# Patient Record
Sex: Male | Born: 1935 | Race: Black or African American | Hispanic: No | Marital: Married | Smoking: Never smoker
Health system: Southern US, Community
[De-identification: ages and names within clinical notes are randomized; demographics above are authoritative.]

## PROBLEM LIST (undated history)

## (undated) DIAGNOSIS — I2510 Atherosclerotic heart disease of native coronary artery without angina pectoris: Secondary | ICD-10-CM

## (undated) DIAGNOSIS — E119 Type 2 diabetes mellitus without complications: Secondary | ICD-10-CM

## (undated) DIAGNOSIS — I10 Essential (primary) hypertension: Secondary | ICD-10-CM

---

## 2003-10-09 ENCOUNTER — Encounter: Admission: RE | Admit: 2003-10-09 | Discharge: 2003-10-09 | Payer: Self-pay | Admitting: Family Medicine

## 2003-12-10 ENCOUNTER — Encounter: Admission: RE | Admit: 2003-12-10 | Discharge: 2003-12-31 | Payer: Self-pay | Admitting: Neurosurgery

## 2004-01-06 ENCOUNTER — Encounter: Payer: Self-pay | Admitting: Family Medicine

## 2004-04-28 ENCOUNTER — Ambulatory Visit: Payer: Self-pay | Admitting: Family Medicine

## 2004-05-23 ENCOUNTER — Emergency Department (HOSPITAL_COMMUNITY): Admission: EM | Admit: 2004-05-23 | Discharge: 2004-05-24 | Payer: Self-pay | Admitting: Emergency Medicine

## 2004-05-26 ENCOUNTER — Ambulatory Visit (HOSPITAL_COMMUNITY): Admission: RE | Admit: 2004-05-26 | Discharge: 2004-05-26 | Payer: Self-pay | Admitting: Gastroenterology

## 2004-06-09 ENCOUNTER — Inpatient Hospital Stay (HOSPITAL_BASED_OUTPATIENT_CLINIC_OR_DEPARTMENT_OTHER): Admission: RE | Admit: 2004-06-09 | Discharge: 2004-06-09 | Payer: Self-pay | Admitting: Cardiology

## 2010-04-24 ENCOUNTER — Encounter: Payer: Self-pay | Admitting: Cardiology

## 2012-03-01 ENCOUNTER — Emergency Department (HOSPITAL_COMMUNITY)
Admission: EM | Admit: 2012-03-01 | Discharge: 2012-03-01 | Disposition: A | Discharge status: Home / Self Care | Payer: Medicare Other | Attending: Emergency Medicine | Admitting: Emergency Medicine

## 2012-03-01 ENCOUNTER — Emergency Department (HOSPITAL_COMMUNITY): Payer: Medicare Other

## 2012-03-01 ENCOUNTER — Encounter (HOSPITAL_COMMUNITY): Payer: Self-pay | Admitting: *Deleted

## 2012-03-01 DIAGNOSIS — N400 Enlarged prostate without lower urinary tract symptoms: Secondary | ICD-10-CM | POA: Insufficient documentation

## 2012-03-01 DIAGNOSIS — E119 Type 2 diabetes mellitus without complications: Secondary | ICD-10-CM | POA: Insufficient documentation

## 2012-03-01 DIAGNOSIS — I10 Essential (primary) hypertension: Secondary | ICD-10-CM | POA: Insufficient documentation

## 2012-03-01 DIAGNOSIS — N23 Unspecified renal colic: Secondary | ICD-10-CM | POA: Insufficient documentation

## 2012-03-01 DIAGNOSIS — I2510 Atherosclerotic heart disease of native coronary artery without angina pectoris: Secondary | ICD-10-CM | POA: Insufficient documentation

## 2012-03-01 DIAGNOSIS — Z79899 Other long term (current) drug therapy: Secondary | ICD-10-CM | POA: Insufficient documentation

## 2012-03-01 DIAGNOSIS — K5900 Constipation, unspecified: Secondary | ICD-10-CM | POA: Insufficient documentation

## 2012-03-01 HISTORY — DX: Essential (primary) hypertension: I10

## 2012-03-01 HISTORY — DX: Atherosclerotic heart disease of native coronary artery without angina pectoris: I25.10

## 2012-03-01 HISTORY — DX: Type 2 diabetes mellitus without complications: E11.9

## 2012-03-01 LAB — CBC WITH DIFFERENTIAL/PLATELET
Basophils Relative: 0 % (ref 0–1)
Eosinophils Absolute: 0.1 10*3/uL (ref 0.0–0.7)
Hemoglobin: 12.9 g/dL — ABNORMAL LOW (ref 13.0–17.0)
MCH: 31.2 pg (ref 26.0–34.0)
MCHC: 34.1 g/dL (ref 30.0–36.0)
Monocytes Relative: 7 % (ref 3–12)
Neutrophils Relative %: 62 % (ref 43–77)
RDW: 12.9 % (ref 11.5–15.5)

## 2012-03-01 LAB — URINALYSIS, ROUTINE W REFLEX MICROSCOPIC
Bilirubin Urine: NEGATIVE
Ketones, ur: NEGATIVE mg/dL
Nitrite: NEGATIVE
Protein, ur: NEGATIVE mg/dL
Urobilinogen, UA: 0.2 mg/dL (ref 0.0–1.0)
pH: 5.5 (ref 5.0–8.0)

## 2012-03-01 LAB — COMPREHENSIVE METABOLIC PANEL
Albumin: 4.1 g/dL (ref 3.5–5.2)
Alkaline Phosphatase: 64 U/L (ref 39–117)
BUN: 24 mg/dL — ABNORMAL HIGH (ref 6–23)
Creatinine, Ser: 1.49 mg/dL — ABNORMAL HIGH (ref 0.50–1.35)
Potassium: 4 mEq/L (ref 3.5–5.1)
Total Protein: 8.1 g/dL (ref 6.0–8.3)

## 2012-03-01 LAB — URINE MICROSCOPIC-ADD ON

## 2012-03-01 IMAGING — CT CT ABD-PELV W/O CM
1 of 4 series · 14 of 32 positions shown, 18 images · non-contrast
Comparison: Acute abdominal series [DATE]

CLINICAL DATA: Right flank pain

CT ABDOMEN AND PELVIS WITHOUT CONTRAST
TECHNIQUE: Multidetector CT imaging of the abdomen and pelvis was
performed following the standard protocol without intravenous
contrast.

[Series 2: abd/pel w/o · axial · non-contrast · 0.74mm/px · z∈[-401,-31]mm · 14 of 82 slices shown, 18 images]
[im 4/82  soft-tissue]
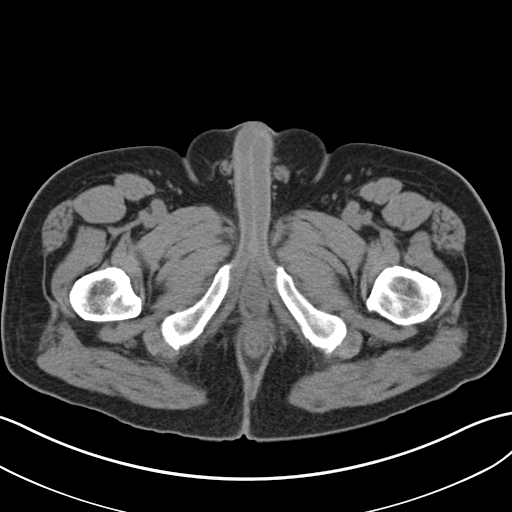
[im 4/82  bone]
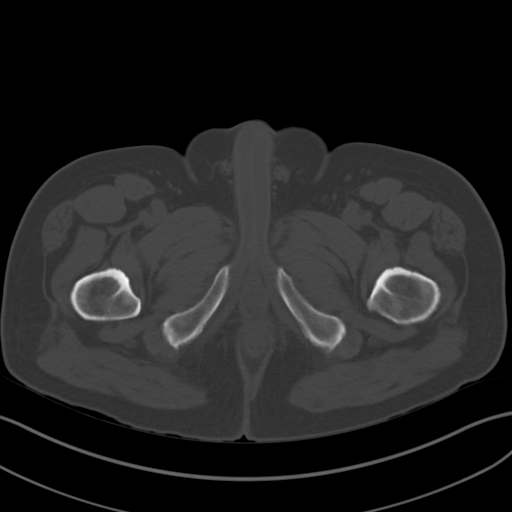
[im 12/82  soft-tissue]
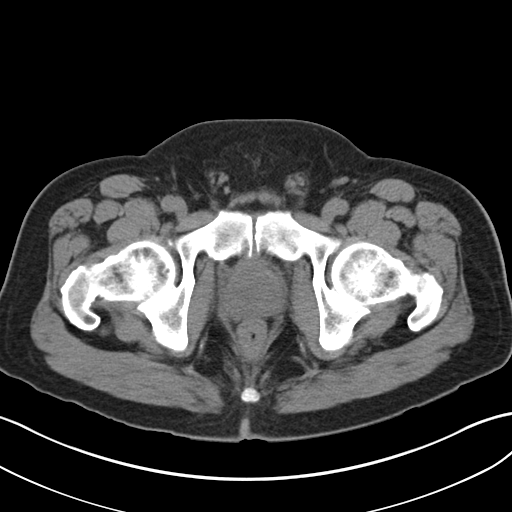
[im 19/82  soft-tissue]
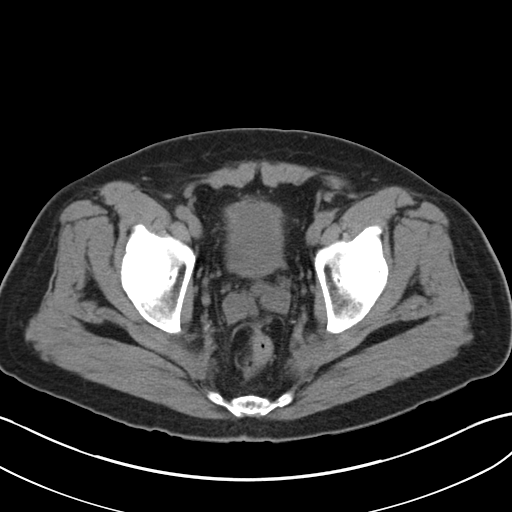
[im 26/82  soft-tissue]
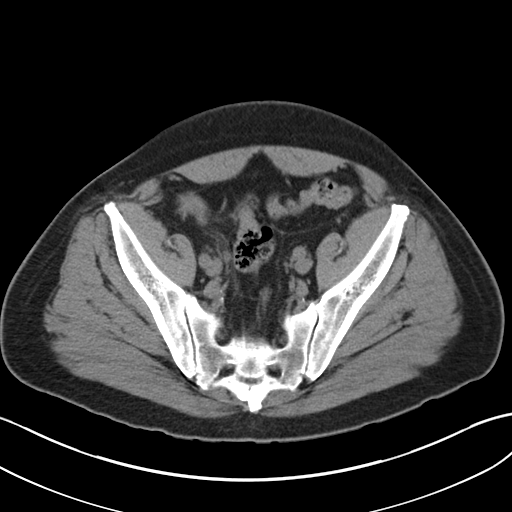
[im 30/82  soft-tissue]
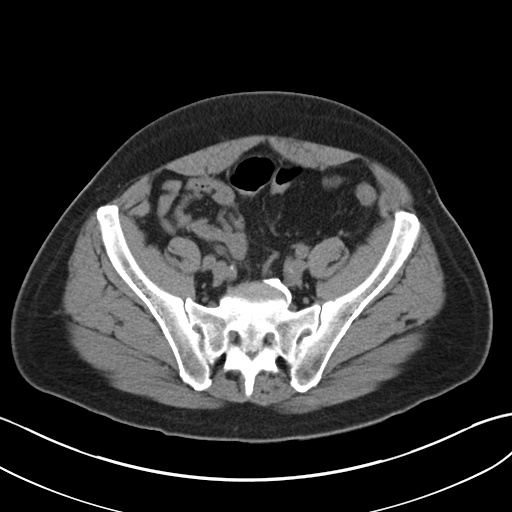
[im 37/82  soft-tissue]
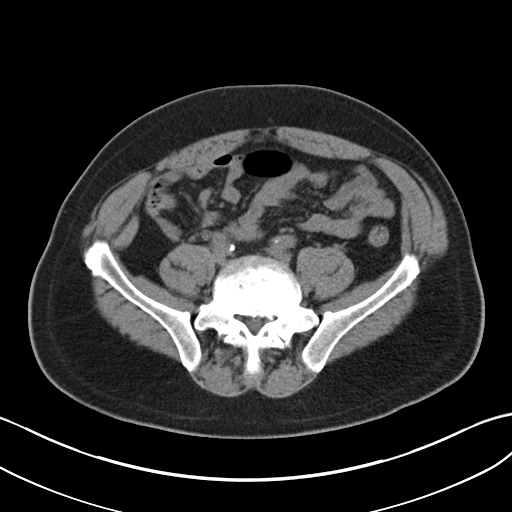
[im 45/82  soft-tissue]
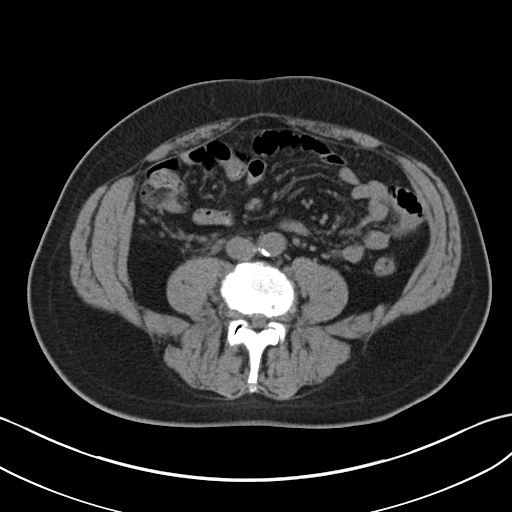
[im 52/82  soft-tissue]
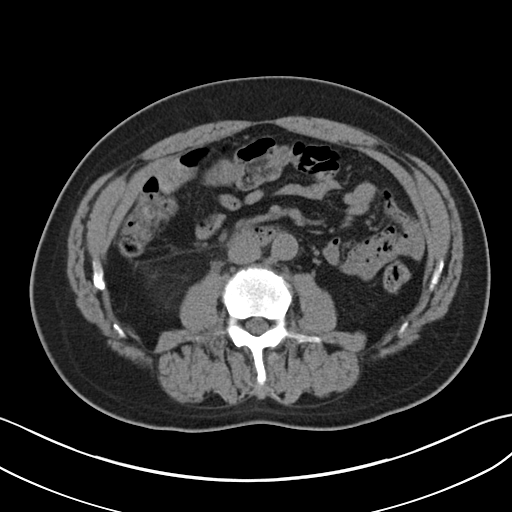
[im 56/82  soft-tissue]
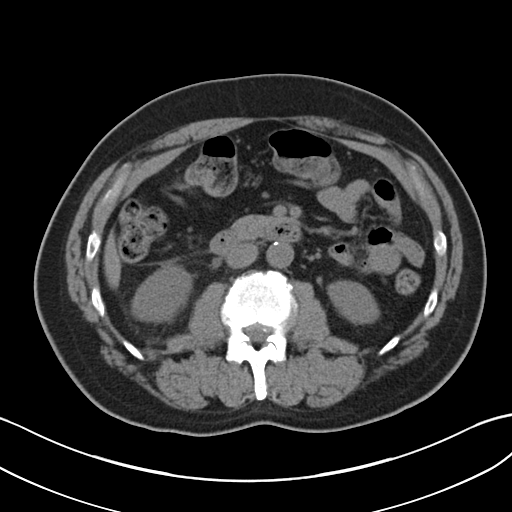
[im 56/82  bone]
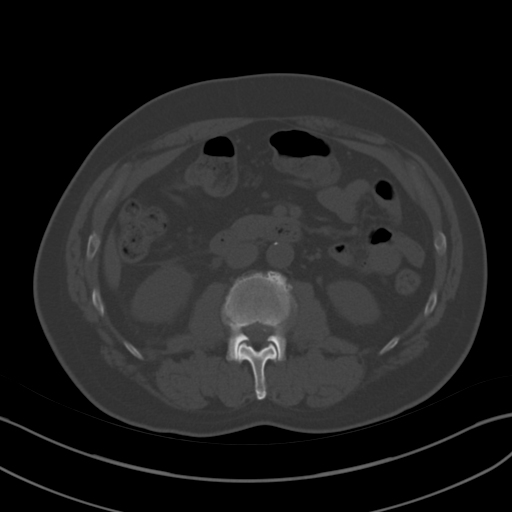
[im 63/82  soft-tissue]
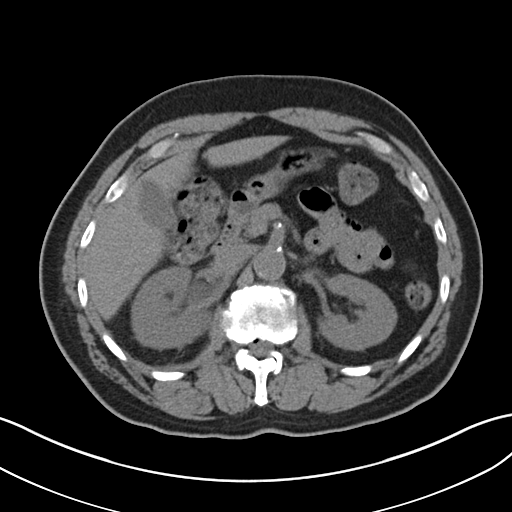
[im 67/82  lung]
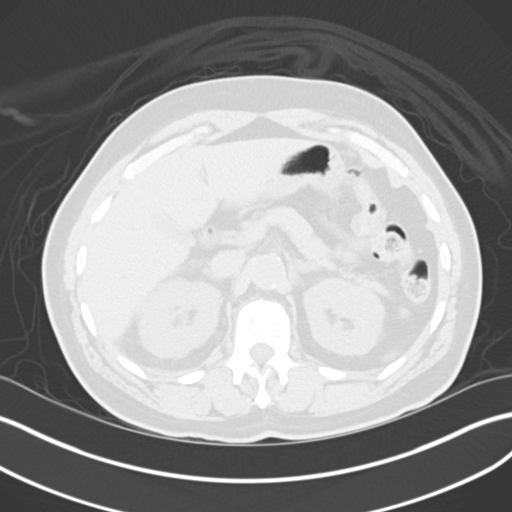
[im 70/82  soft-tissue]
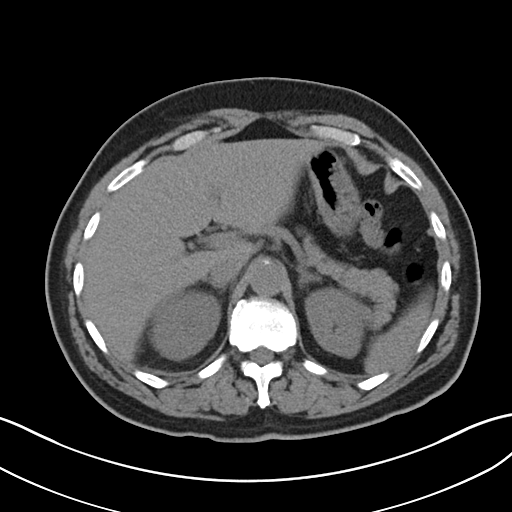
[im 70/82  lung]
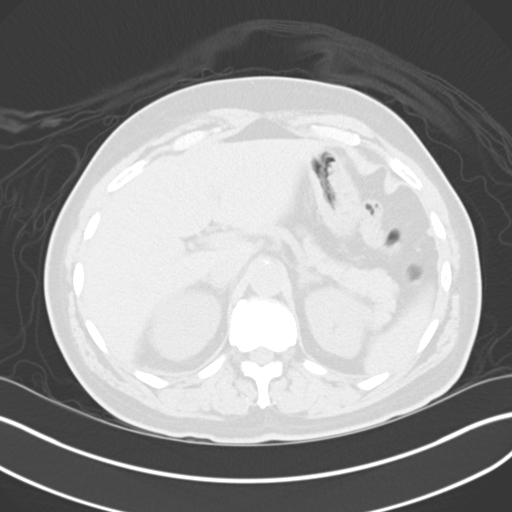
[im 74/82  lung]
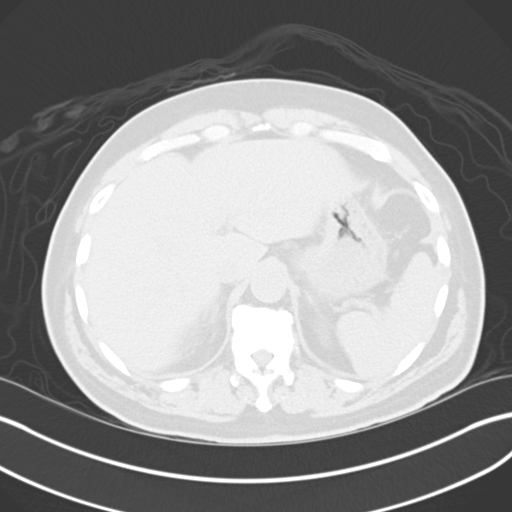
[im 78/82  soft-tissue]
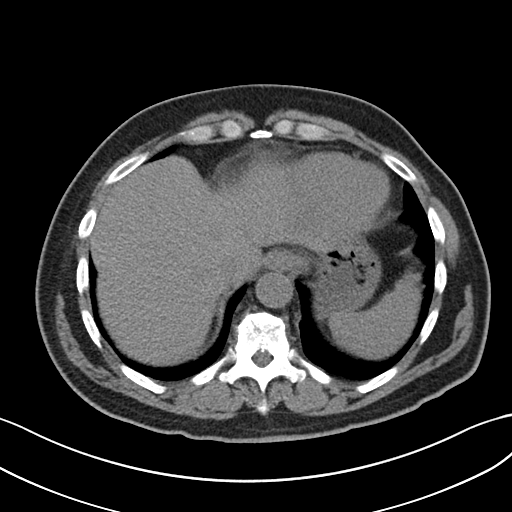
[im 78/82  lung]
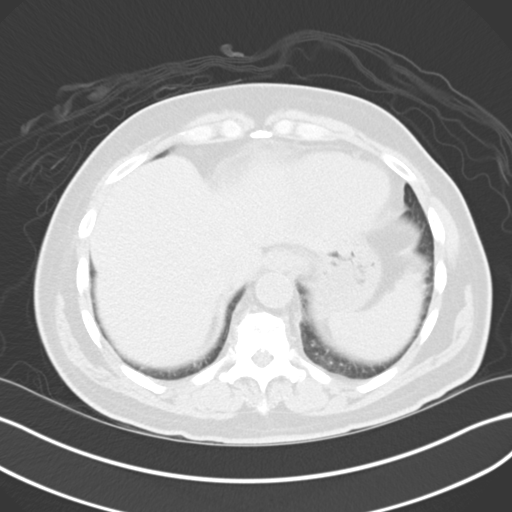

[14 of 32 positions shown; findings below may reference images not displayed]

FINDINGS: Inferior lung bases:  Atherosclerotic calcification of
the right coronary artery is visible.  The heart is not completely
imaged but appears enlarged.

The dome of the liver is not included on the study, per the CT
stone protocol.  The visualized portion of the liver, spleen,
adrenal glands, left kidney, gallbladder, and pancreas appear
within normal limits for noncontrast imaging.

There is moderate right sided hydroureteronephrosis.  This is
secondary to a 2 mm stone in the distal right ureter.  This stone
is approximately 1 cm proximal to the ureterovesical junction.
There is mild right perinephric and periureteric stranding.

No intrarenal calculi are seen on the right.  No renal lesions are
identified by noncontrast CT.

The stomach is decompressed.  Small bowel loops and colon are
normal in caliber.  Abdominal aorta is normal in caliber and
contains scattered atherosclerotic calcification.

Urinary bladder is not very distended at the time imaging.  Bladder
wall thickness measures 5 mm, and mild bladder wall thickening
cannot be excluded.

Prostamegaly is noted.  Prostate measures 5 cm AP diameter by
cm transverse diameter, and bulges into the bladder base.

Negative for abdominal or pelvic lymphadenopathy.

A tiny umbilical hernia contains fat only.  There is a tiny amount
of ascites in the dependent portion of the pelvis.

Multilevel degenerative disc disease is present, most prominent at
L4-5, where there is disc space narrowing, posterior disc
osteophyte complex and vacuum disc phenomenon..  No acute or
suspicious bony abnormality.
IMPRESSION: 1.  2 mm distal right ureteral stone results in moderate right-
sided hydroureteronephrosis and perinephric stranding.
2.  No additional urinary tract calculi identified.

3.  Prostamegaly.  Correlation with PSA values should be
considered.  Cannot exclude mild urinary bladder wall thickening
due to chronic bladder outlet obstruction (apparent wall thickening
could be due to the underdistension of the bladder at the time of
imaging).
4.  Multilevel degenerative disc disease.

## 2012-03-01 MED ORDER — TAMSULOSIN HCL 0.4 MG PO CAPS: 0.4000 mg | ORAL_CAPSULE | Freq: Every day | ORAL | Status: AC

## 2012-03-01 MED ORDER — HYDROCODONE-ACETAMINOPHEN 5-325 MG PO TABS: ORAL_TABLET | ORAL | Status: DC

## 2012-03-01 NOTE — Progress Notes (Signed)
  CARE MANAGEMENT ED NOTE 03/01/2012  Patient:  CAREL, CARRIER   Account Number:  [id number]  Date Initiated:  03/01/2012  Documentation initiated by:  Edd Arbour  Subjective/Objective Assessment:   42 male [employer] medicare no pcp listed Pt states pcp is Dr Marlane Mingle n [facility] c/o constipation imaging shows non obstructive gas pattern     Subjective/Objective Assessment Detail:     Action/Plan:   spoke with pt EPIC updated   Action/Plan Detail:   Anticipated DC Date:  03/01/2012     Status Recommendation to Physician:   Result of Recommendation:    Other ED Services  Consult Working Plan    DC Planning Services  PCP issues  Other    Choice offered to / List presented to:            Status of service:  Completed, signed off  ED Comments:   ED Comments Detail:

## 2012-03-01 NOTE — ED Notes (Signed)
Pt presents with abd pain in rt flank area, constipation, last BM yesterday 4am

## 2012-03-01 NOTE — ED Provider Notes (Signed)
Medical screening examination/treatment/procedure(s) were conducted as a shared visit with non-physician practitioner(s) and myself.  I personally evaluated the patient during the encounter  Pt with ttp right flank region.  Mentions poor appetite as well.  No pulsatile mass on exam.  Considering his age and the undefined etiology, will check labs, ua and CT scan of abdomen and pelvis.  Celene Kras, MD 03/01/12 [phone]

## 2012-03-01 NOTE — ED Provider Notes (Signed)
History     CSN: [id number]  Arrival date & time 03/01/12  1329   First MD Initiated Contact with Patient 03/01/12 1358      Chief Complaint  Patient presents with  . Abdominal Pain  . Constipation  . Flank Pain    (Consider location/radiation/quality/duration/timing/severity/associated sxs/prior treatment) HPI Comments: Patients with history of diabetes, no previous abdominal surgeries or abdominal problems presents today with 24 hour history of right lower back, flank pain. This is been associated with constipation. Patient states that his last bowel movement was yesterday at 4 AM and he typically has 2 bowel movements a day. He was not constipated before this does not have a history of chronic constipation. Patient denies any fevers, chest pain, shortness of breath, painful urination, trouble with urination, diarrhea. Patient has taken Senokot and some oral laxatives without relief. Onset acute. Course is constant. Nothing makes symptoms better worse.   Patient is a 78 y.o. male presenting with abdominal pain, constipation, and flank pain. The history is provided by the patient.  Abdominal Pain The primary symptoms of the illness include abdominal pain. The primary symptoms of the illness do not include fever, nausea, vomiting, diarrhea or dysuria.  Additional symptoms associated with the illness include constipation.  Constipation  Associated symptoms include abdominal pain. Pertinent negatives include no fever, no diarrhea, no nausea, no vomiting, no chest pain, no headaches, no coughing and no rash.  Flank Pain Associated symptoms include abdominal pain. Pertinent negatives include no chest pain, coughing, fever, headaches, myalgias, nausea, rash, sore throat or vomiting.    Past Medical History  Diagnosis Date  . Diabetes mellitus without complication   . Hypertension   . Coronary artery disease     History reviewed. No pertinent past surgical history.  No family history  on file.  History  Substance Use Topics  . Smoking status: Never Smoker   . Smokeless tobacco: Not on file  . Alcohol Use: No      Review of Systems  Constitutional: Negative for fever.  HENT: Negative for sore throat and rhinorrhea.   Eyes: Negative for redness.  Respiratory: Negative for cough.   Cardiovascular: Negative for chest pain.  Gastrointestinal: Positive for abdominal pain and constipation. Negative for nausea, vomiting and diarrhea.  Genitourinary: Positive for flank pain. Negative for dysuria.  Musculoskeletal: Negative for myalgias.  Skin: Negative for rash.  Neurological: Negative for headaches.    Allergies  Review of patient's allergies indicates no known allergies.  Home Medications   Current Outpatient Rx  Name  Route  Sig  Dispense  Refill  . CARVEDILOL 25 MG PO TABS   Oral   Take 25 mg by mouth 2 (two) times daily with a meal.         . GLIMEPIRIDE 2 MG PO TABS   Oral   Take 2 mg by mouth daily before breakfast.         . GUAIFENESIN 100 MG/5ML PO SYRP   Oral   Take 200 mg by mouth 3 (three) times daily as needed. cough         . METFORMIN HCL ER (OSM) 1000 MG PO TB24   Oral   Take 1,000 mg by mouth 2 (two) times daily with a meal.         . OLMESARTAN MEDOXOMIL 40 MG PO TABS   Oral   Take 40 mg by mouth daily.         Marland Kitchen RAMIPRIL 10 MG PO CAPS  Oral   Take 10 mg by mouth 2 (two) times daily.         Bernadette Hoit SODIUM 8.6-50 MG PO TABS   Oral   Take 2 tablets by mouth daily.         Marland Kitchen SIMETHICONE 80 MG PO CHEW   Oral   Chew 80 mg by mouth every 6 (six) hours as needed. Gas pain         . SIMVASTATIN 40 MG PO TABS   Oral   Take 40 mg by mouth every evening.         Marland Kitchen SPIRONOLACTONE 25 MG PO TABS   Oral   Take 25 mg by mouth 2 (two) times daily.         Marland Kitchen HYDROCODONE-ACETAMINOPHEN 5-325 MG PO TABS      Take 0.5 - 1 tablet every 6 hours as needed for severe pain   8 tablet   0   . TAMSULOSIN  HCL 0.4 MG PO CAPS   Oral   Take 1 capsule (0.4 mg total) by mouth daily.   7 capsule   0     BP 189/79  Pulse 68  Temp 97.7 F (36.5 C) (Oral)  Resp 20  SpO2 100%  Physical Exam  Nursing note and vitals reviewed. Constitutional: He appears well-developed and well-nourished.  HENT:  Head: Normocephalic and atraumatic.  Eyes: Conjunctivae normal are normal. Right eye exhibits no discharge. Left eye exhibits no discharge.  Neck: Normal range of motion. Neck supple.  Cardiovascular: Normal rate, regular rhythm and normal heart sounds.   No murmur heard. Pulmonary/Chest: Effort normal and breath sounds normal. No respiratory distress. He has no wheezes. He has no rales.  Abdominal: Soft. Bowel sounds are decreased. There is no tenderness. There is no rigidity, no rebound, no guarding and no CVA tenderness.  Neurological: He is alert.  Skin: Skin is warm and dry.  Psychiatric: He has a normal mood and affect.    ED Course  Procedures (including critical care time)  Labs Reviewed  URINALYSIS, ROUTINE W REFLEX MICROSCOPIC - Abnormal; Notable for the following:    APPearance CLOUDY (*)     Hgb urine dipstick LARGE (*)     All other components within normal limits  CBC WITH DIFFERENTIAL - Abnormal; Notable for the following:    RBC 4.13 (*)     Hemoglobin 12.9 (*)     HCT 37.8 (*)     All other components within normal limits  COMPREHENSIVE METABOLIC PANEL - Abnormal; Notable for the following:    Glucose, Bld 188 (*)     BUN 24 (*)     Creatinine, Ser 1.49 (*)     GFR calc non Af Amer 43 (*)     GFR calc Af Amer 50 (*)     All other components within normal limits  URINE MICROSCOPIC-ADD ON - Abnormal; Notable for the following:    Bacteria, UA FEW (*)     All other components within normal limits   Ct Abdomen Pelvis Wo Contrast  03/01/2012  *RADIOLOGY REPORT*  Clinical Data: Right flank pain  CT ABDOMEN AND PELVIS WITHOUT CONTRAST  Technique:  Multidetector CT imaging  of the abdomen and pelvis was performed following the standard protocol without intravenous contrast.  Comparison: Acute abdominal series 03/01/2012  Findings: Inferior lung bases:  Atherosclerotic calcification of the right coronary artery is visible.  The heart is not completely imaged but appears enlarged.  The dome  of the liver is not included on the study, per the CT stone protocol.  The visualized portion of the liver, spleen, adrenal glands, left kidney, gallbladder, and pancreas appear within normal limits for noncontrast imaging.  There is moderate right sided hydroureteronephrosis.  This is secondary to a 2 mm stone in the distal right ureter.  This stone is approximately 1 cm proximal to the ureterovesical junction. There is mild right perinephric and periureteric stranding.  No intrarenal calculi are seen on the right.  No renal lesions are identified by noncontrast CT.  The stomach is decompressed.  Small bowel loops and colon are normal in caliber.  Abdominal aorta is normal in caliber and contains scattered atherosclerotic calcification.  Urinary bladder is not very distended at the time imaging.  Bladder wall thickness measures 5 mm, and mild bladder wall thickening cannot be excluded.  Prostamegaly is noted.  Prostate measures 5 cm AP diameter by 4.8 cm transverse diameter, and bulges into the bladder base.  Negative for abdominal or pelvic lymphadenopathy.  A tiny umbilical hernia contains fat only.  There is a tiny amount of ascites in the dependent portion of the pelvis.  Multilevel degenerative disc disease is present, most prominent at L4-5, where there is disc space narrowing, posterior disc osteophyte complex and vacuum disc phenomenon.  No acute or suspicious bony abnormality.  IMPRESSION:  1.  2 mm distal right ureteral stone results in moderate right- sided hydroureteronephrosis and perinephric stranding. 2.  No additional urinary tract calculi identified.  3.  Prostamegaly.  Correlation  with PSA values should be considered.  Cannot exclude mild urinary bladder wall thickening due to chronic bladder outlet obstruction (apparent wall thickening could be due to the underdistension of the bladder at the time of imaging). 4.  Multilevel degenerative disc disease.   Original Report Authenticated By: Britta Mccreedy, M.D.    Dg Abd Acute W/chest  03/01/2012  *RADIOLOGY REPORT*  Clinical Data: Abdominal pain.  Question constipation.  ACUTE ABDOMEN SERIES (ABDOMEN 2 VIEW & CHEST 1 VIEW)  Comparison: None.  Findings: Mild cardiomegaly.  Mild tortuosity of the thoracic aorta.  Hilar contours are normal.  Trachea is midline.  Pulmonary vascularity is normal and the lungs are normally expanded and clear.  There is no evidence of pleural effusion or pneumothorax.  The bowel gas pattern is nonobstructive.  No significant stool burden is visualized.  No radiopaque urinary tract calculi visualized.  Slight atherosclerotic calcifications of the iliac vessels.  IMPRESSION:  1.  Nonobstructive bowel gas pattern. 2.  Mild cardiomegaly.  No acute cardiopulmonary disease.   Original Report Authenticated By: Britta Mccreedy, M.D.      1. Ureteral colic   2. Enlarged prostate     2:25 PM Patient seen and examined. Work-up initiated.   Vital signs reviewed and are as follows: Filed Vitals:   03/01/12 1338  BP: 189/79  Pulse: 68  Temp: 97.7 F (36.5 C)  Resp: 20   Patient discussed and seen by Dr. Lynelle Doctor. Blood in urine suspicious for kidney stone. CT ordered showing 2mm right stone as well as enlarged prostate. Family informed. Patient feels better and wants to go home.   Patient counseled on kidney stone treatment. Urged patient to strain urine and save any stones. Urged urology follow-up and return to [facility] with any complications. Counseled patient to maintain good fluid intake.   Counseled patient on use of flomax.   Patient counseled on use of narcotic pain medications. Discussed risk of falls  in  elderly and to use lowest dose needed under supervision. Counseled not to combine these medications with others containing tylenol. Urged not to drink alcohol, drive, or perform any other activities that requires focus while taking these medications. The patient verbalizes understanding and agrees with the plan.  Urged PCP for follow-up for prostatic hypertrophy.   Counseled to follow-up with PCP also regarding HTN.   MDM  Flank pain: 2mm distal ureter stone on CT. No UTI suspected.   HTN: no gross signs of end-organ damage although patient has mild renal insufficiency that can be further evaluated by PCP. No concern for post-renal AKI.   Prostatic hypertrophy: to be followed by PCP. Pt aware. No previous symptoms from this.       Renne Crigler, [place] 03/01/12 [phone]

## 2012-03-01 NOTE — ED Notes (Signed)
Patient transported to CT 

## 2012-03-02 NOTE — ED Provider Notes (Signed)
Symptoms and CT scan were consistent with ureteral colic.  Pt felt better after treatment.  DC home with outpatient treatment and follow up  Celene Kras, MD 03/02/12 [phone]

## 2012-03-04 ENCOUNTER — Observation Stay (HOSPITAL_COMMUNITY)
Admission: AD | Admit: 2012-03-04 | Discharge: 2012-03-06 | Disposition: A | Discharge status: Home / Self Care | Payer: Medicare Other | Source: Ambulatory Visit | Attending: Cardiovascular Disease | Admitting: Cardiovascular Disease

## 2012-03-04 ENCOUNTER — Encounter (HOSPITAL_COMMUNITY): Payer: Self-pay | Admitting: Urology

## 2012-03-04 DIAGNOSIS — Z79899 Other long term (current) drug therapy: Secondary | ICD-10-CM | POA: Insufficient documentation

## 2012-03-04 DIAGNOSIS — I129 Hypertensive chronic kidney disease with stage 1 through stage 4 chronic kidney disease, or unspecified chronic kidney disease: Secondary | ICD-10-CM | POA: Insufficient documentation

## 2012-03-04 DIAGNOSIS — R109 Unspecified abdominal pain: Secondary | ICD-10-CM | POA: Insufficient documentation

## 2012-03-04 DIAGNOSIS — E119 Type 2 diabetes mellitus without complications: Secondary | ICD-10-CM | POA: Insufficient documentation

## 2012-03-04 DIAGNOSIS — I079 Rheumatic tricuspid valve disease, unspecified: Secondary | ICD-10-CM | POA: Insufficient documentation

## 2012-03-04 DIAGNOSIS — K5900 Constipation, unspecified: Secondary | ICD-10-CM | POA: Insufficient documentation

## 2012-03-04 DIAGNOSIS — N200 Calculus of kidney: Principal | ICD-10-CM | POA: Insufficient documentation

## 2012-03-04 DIAGNOSIS — E871 Hypo-osmolality and hyponatremia: Secondary | ICD-10-CM | POA: Insufficient documentation

## 2012-03-04 DIAGNOSIS — N182 Chronic kidney disease, stage 2 (mild): Secondary | ICD-10-CM | POA: Insufficient documentation

## 2012-03-04 DIAGNOSIS — I059 Rheumatic mitral valve disease, unspecified: Secondary | ICD-10-CM | POA: Insufficient documentation

## 2012-03-04 LAB — COMPREHENSIVE METABOLIC PANEL
AST: 14 U/L (ref 0–37)
BUN: 18 mg/dL (ref 6–23)
CO2: 27 mEq/L (ref 19–32)
Calcium: 10 mg/dL (ref 8.4–10.5)
Chloride: 89 mEq/L — ABNORMAL LOW (ref 96–112)
Creatinine, Ser: 1.79 mg/dL — ABNORMAL HIGH (ref 0.50–1.35)
GFR calc non Af Amer: 35 mL/min — ABNORMAL LOW (ref >90)
Total Bilirubin: 0.8 mg/dL (ref 0.3–1.2)

## 2012-03-04 LAB — URINALYSIS, ROUTINE W REFLEX MICROSCOPIC
Bilirubin Urine: NEGATIVE
Hgb urine dipstick: NEGATIVE
Specific Gravity, Urine: 1.014 (ref 1.005–1.030)
Urobilinogen, UA: 0.2 mg/dL (ref 0.0–1.0)
pH: 5.5 (ref 5.0–8.0)

## 2012-03-04 LAB — CBC WITH DIFFERENTIAL/PLATELET
Eosinophils Absolute: 0.1 10*3/uL (ref 0.0–0.7)
Hemoglobin: 12.9 g/dL — ABNORMAL LOW (ref 13.0–17.0)
Lymphocytes Relative: 25 % (ref 12–46)
Lymphs Abs: 2 10*3/uL (ref 0.7–4.0)
MCH: 31.1 pg (ref 26.0–34.0)
Monocytes Relative: 9 % (ref 3–12)
Neutro Abs: 5.1 10*3/uL (ref 1.7–7.7)
Neutrophils Relative %: 65 % (ref 43–77)
Platelets: 193 10*3/uL (ref 150–400)
RBC: 4.15 MIL/uL — ABNORMAL LOW (ref 4.22–5.81)
WBC: 7.9 10*3/uL (ref 4.0–10.5)

## 2012-03-04 MED ORDER — SPIRONOLACTONE 25 MG PO TABS
25.0000 mg | ORAL_TABLET | Freq: Two times a day (BID) | ORAL | Status: DC
  Administered 2012-03-05 – 2012-03-06 (×3): 25 mg via ORAL
  Filled 2012-03-04 (×5): qty 1

## 2012-03-04 MED ORDER — GUAIFENESIN 100 MG/5ML PO SYRP: 200.0000 mg | ORAL_SOLUTION | Freq: Three times a day (TID) | ORAL | Status: DC | PRN

## 2012-03-04 MED ORDER — FLEET ENEMA 7-19 GM/118ML RE ENEM
1.0000 | ENEMA | Freq: Once | RECTAL | Status: AC | PRN
  Administered 2012-03-04: 1 via RECTAL
  Filled 2012-03-04: qty 1

## 2012-03-04 MED ORDER — GLIMEPIRIDE 2 MG PO TABS
2.0000 mg | ORAL_TABLET | Freq: Every day | ORAL | Status: DC
  Administered 2012-03-05 – 2012-03-06 (×2): 2 mg via ORAL
  Filled 2012-03-04 (×4): qty 1

## 2012-03-04 MED ORDER — CARVEDILOL 25 MG PO TABS
25.0000 mg | ORAL_TABLET | Freq: Two times a day (BID) | ORAL | Status: DC
  Administered 2012-03-04 – 2012-03-06 (×4): 25 mg via ORAL
  Filled 2012-03-04 (×7): qty 1

## 2012-03-04 MED ORDER — ADULT MULTIVITAMIN W/MINERALS CH
1.0000 | ORAL_TABLET | Freq: Every day | ORAL | Status: DC
  Administered 2012-03-04 – 2012-03-06 (×3): 1 via ORAL
  Filled 2012-03-04 (×3): qty 1

## 2012-03-04 MED ORDER — SODIUM CHLORIDE 0.9 % IV SOLN
INTRAVENOUS | Status: DC
  Administered 2012-03-04 – 2012-03-05 (×4): via INTRAVENOUS

## 2012-03-04 MED ORDER — TAMSULOSIN HCL 0.4 MG PO CAPS
0.4000 mg | ORAL_CAPSULE | Freq: Every day | ORAL | Status: DC
  Administered 2012-03-05 – 2012-03-06 (×2): 0.4 mg via ORAL
  Filled 2012-03-04 (×2): qty 1

## 2012-03-04 MED ORDER — ONDANSETRON HCL 4 MG/2ML IJ SOLN: 4.0000 mg | Freq: Four times a day (QID) | INTRAMUSCULAR | Status: DC | PRN

## 2012-03-04 MED ORDER — HYDROCODONE-ACETAMINOPHEN 5-325 MG PO TABS
1.0000 | ORAL_TABLET | ORAL | Status: DC | PRN
  Administered 2012-03-05 – 2012-03-06 (×8): 2 via ORAL
  Filled 2012-03-04 (×8): qty 2

## 2012-03-04 MED ORDER — DOCUSATE SODIUM 100 MG PO CAPS
100.0000 mg | ORAL_CAPSULE | Freq: Two times a day (BID) | ORAL | Status: DC
  Administered 2012-03-04 – 2012-03-06 (×4): 100 mg via ORAL
  Filled 2012-03-04 (×5): qty 1

## 2012-03-04 MED ORDER — SIMVASTATIN 40 MG PO TABS
40.0000 mg | ORAL_TABLET | Freq: Every evening | ORAL | Status: DC
  Administered 2012-03-04 – 2012-03-05 (×2): 40 mg via ORAL
  Filled 2012-03-04 (×3): qty 1

## 2012-03-04 MED ORDER — HYDROMORPHONE HCL PF 1 MG/ML IJ SOLN
1.0000 mg | INTRAMUSCULAR | Status: DC | PRN
  Administered 2012-03-04 – 2012-03-05 (×4): 1 mg via INTRAVENOUS
  Filled 2012-03-04 (×5): qty 1

## 2012-03-04 MED ORDER — HYDROCODONE-ACETAMINOPHEN 5-325 MG PO TABS: 1.0000 | ORAL_TABLET | Freq: Four times a day (QID) | ORAL | Status: DC | PRN

## 2012-03-04 MED ORDER — METFORMIN HCL ER 500 MG PO TB24: 1000.0000 mg | ORAL_TABLET | Freq: Two times a day (BID) | ORAL | Status: DC

## 2012-03-04 MED ORDER — SIMETHICONE 80 MG PO CHEW
80.0000 mg | CHEWABLE_TABLET | Freq: Four times a day (QID) | ORAL | Status: DC | PRN
  Filled 2012-03-04: qty 1

## 2012-03-04 MED ORDER — ASPIRIN EC 81 MG PO TBEC
81.0000 mg | DELAYED_RELEASE_TABLET | Freq: Every day | ORAL | Status: DC
  Administered 2012-03-04 – 2012-03-06 (×3): 81 mg via ORAL
  Filled 2012-03-04 (×3): qty 1

## 2012-03-04 MED ORDER — GUAIFENESIN 100 MG/5ML PO SOLN: 10.0000 mL | Freq: Three times a day (TID) | ORAL | Status: DC | PRN

## 2012-03-04 MED ORDER — ONDANSETRON HCL 4 MG PO TABS: 4.0000 mg | ORAL_TABLET | Freq: Four times a day (QID) | ORAL | Status: DC | PRN

## 2012-03-04 NOTE — Progress Notes (Signed)
Notified Dr. Algie Coffer of patient's high bp 168/74. He will review patient's home blood pressure medications. Will continue to monitor patient. Erskin Burnet RN

## 2012-03-04 NOTE — H&P (Signed)
Kurt Moran is an 78 y.o. male.   Chief Complaint: Abdominal pain HPI: 78 years old male has 3 day history of abdominal pain and constipation. Pain is severe and both flank areas. No fever  Past Medical History  Diagnosis Date  . Diabetes mellitus without complication   . Hypertension   . Coronary artery disease       History reviewed. No pertinent past surgical history.  History reviewed. No pertinent family history. Social History:  reports that he has never smoked. His smokeless tobacco use includes Chew. He reports that he does not drink alcohol or use illicit drugs.  Allergies: No Known Allergies  Medications Prior to Admission  Medication Sig Dispense Refill  . carvedilol (COREG) 25 MG tablet Take 25 mg by mouth 2 (two) times daily with a meal.      . glimepiride (AMARYL) 2 MG tablet Take 2 mg by mouth daily before breakfast.      . guaifenesin (ROBITUSSIN) 100 MG/5ML syrup Take 200 mg by mouth 3 (three) times daily as needed. cough      . HYDROcodone-acetaminophen (NORCO/VICODIN) 5-325 MG per tablet Take 0.5 - 1 tablet every 6 hours as needed for severe pain  8 tablet  0  . metformin (FORTAMET) 1000 MG (OSM) 24 hr tablet Take 1,000 mg by mouth 2 (two) times daily with a meal.      . ramipril (ALTACE) 10 MG capsule Take 10 mg by mouth 2 (two) times daily.      Marland Kitchen senna-docusate (SENOKOT-S) 8.6-50 MG per tablet Take 2 tablets by mouth daily.      . simethicone (MYLICON) 80 MG chewable tablet Chew 80 mg by mouth every 6 (six) hours as needed. Gas pain      . simvastatin (ZOCOR) 40 MG tablet Take 40 mg by mouth every evening.      Marland Kitchen spironolactone (ALDACTONE) 25 MG tablet Take 25 mg by mouth 2 (two) times daily.      . Tamsulosin HCl (FLOMAX) 0.4 MG CAPS Take 1 capsule (0.4 mg total) by mouth daily.  7 capsule  0  . olmesartan (BENICAR) 40 MG tablet Take 40 mg by mouth daily.        Results for orders placed during the hospital encounter of 03/04/12 (from the past 48 hour(s))    COMPREHENSIVE METABOLIC PANEL     Status: Abnormal   Collection Time   03/04/12  1:58 PM      Component Value Range Comment   Sodium 128 (*) 135 - 145 mEq/L    Potassium 5.0  3.5 - 5.1 mEq/L    Chloride 89 (*) 96 - 112 mEq/L    CO2 27  19 - 32 mEq/L    Glucose, Bld 244 (*) 70 - 99 mg/dL    BUN 18  6 - 23 mg/dL    Creatinine, Ser 7.82 (*) 0.50 - 1.35 mg/dL    Calcium 95.6  8.4 - 10.5 mg/dL    Total Protein 8.6 (*) 6.0 - 8.3 g/dL    Albumin 4.0  3.5 - 5.2 g/dL    AST 14  0 - 37 U/L    ALT 5  0 - 53 U/L    Alkaline Phosphatase 64  39 - 117 U/L    Total Bilirubin 0.8  0.3 - 1.2 mg/dL    GFR calc non Af Amer 35 (*) >90 mL/min    GFR calc Af Amer 41 (*) >90 mL/min   CBC WITH DIFFERENTIAL  Status: Abnormal   Collection Time   03/04/12  1:58 PM      Component Value Range Comment   WBC 7.9  4.0 - 10.5 K/uL    RBC 4.15 (*) 4.22 - 5.81 MIL/uL    Hemoglobin 12.9 (*) 13.0 - 17.0 g/dL    HCT 81.1 (*) 91.4 - 52.0 %    MCV 91.3  78.0 - 100.0 fL    MCH 31.1  26.0 - 34.0 pg    MCHC 34.0  30.0 - 36.0 g/dL    RDW 78.2  95.6 - 21.3 %    Platelets 193  150 - 400 K/uL    Neutrophils Relative 65  43 - 77 %    Neutro Abs 5.1  1.7 - 7.7 K/uL    Lymphocytes Relative 25  12 - 46 %    Lymphs Abs 2.0  0.7 - 4.0 K/uL    Monocytes Relative 9  3 - 12 %    Monocytes Absolute 0.7  0.1 - 1.0 K/uL    Eosinophils Relative 1  0 - 5 %    Eosinophils Absolute 0.1  0.0 - 0.7 K/uL    Basophils Relative 0  0 - 1 %    Basophils Absolute 0.0  0.0 - 0.1 K/uL    No results found.  CT abdomen and pelvis on 03/01/2012 1. 2 mm distal right ureteral stone results in moderate right-sided hydroureteronephrosis and perinephric stranding.  2. No additional urinary tract calculi identified.  3. Prostamegaly. Correlation with PSA values should be considered. Cannot exclude mild urinary bladder wall thickening due to chronic bladder outlet obstruction (apparent wall thickening could be due to the underdistension of the  bladder at the time of imaging).  4. Multilevel degenerative disc disease.   @ROS @ Review of Systems  Constitutional: Negative for fever.  HENT: Negative for sore throat and rhinorrhea.  Eyes: Negative for redness.  Respiratory: Negative for cough.  Cardiovascular: Negative for chest pain.  Gastrointestinal: Positive for abdominal pain and constipation. Negative for nausea, vomiting and diarrhea.  Genitourinary: Positive for flank pain. Negative for dysuria.  Musculoskeletal: Negative for myalgias.  Skin: Negative for rash.  Neurological: Negative for headaches.   Blood pressure 168/74, pulse 64, temperature 97.8 F (36.6 C), temperature source Oral, resp. rate 20, height 5\' 7"  (1.702 m), weight 86.7 kg (191 lb 2.2 oz), SpO2 98.00%.  Physical Exam   Constitutional: He appears well-developed and well-nourished.  HENT: Normocephalic and atraumatic. Brown eyes, Conjunctivae -pink.Sclera-white.  Neck: Normal range of motion. Neck supple.  Cardiovascular: Normal rate, regular rhythm and normal heart sounds.   Pulmonary/Chest: Effort normal and breath sounds normal. No respiratory distress. He has no wheezes. He has no rales.  Abdominal: Soft. Bowel sounds are decreased. There is no rigidity, no rebound, no guarding and + CVA tenderness.  Neurological: He is alert. Moves all 4 ext. Skin: Skin is warm and dry.  Psychiatric: He has a normal mood and affect  Assessment/Plan Abdominal pain Right kidney stone Hypertension  Place in observation IV fluids Analgesics UA.  Emri Sample S 03/04/2012, 9:15 PM

## 2012-03-04 NOTE — Progress Notes (Signed)
PHARMACIST - PHYSICIAN COMMUNICATION DR:  Algie Coffer CONCERNING:  METFORMIN SAFE ADMINISTRATION POLICY  RECOMMENDATION: Metformin has been placed on DISCONTINUE (rejected order) STATUS and should be reordered only after any of the conditions below are ruled out.  Current safety recommendations include avoiding metformin for a minimum of 48 hours after the patient's exposure to intravenous contrast media.  DESCRIPTION:  The Pharmacy Committee has adopted a policy that restricts the use of metformin in hospitalized patients until all the contraindications to administration have been ruled out. Specific contraindications are: [x]  Serum creatinine ? 1.5 for males []  Serum creatinine ? 1.4 for females []  Shock, acute MI, sepsis, hypoxemia, dehydration []  Planned administration of intravenous iodinated contrast media []  Heart Failure patients with low EF []  Acute or chronic metabolic acidosis (including DKA)     Thanks, Juliette Alcide, PharmD, BCPS.   Pager: [phone] 03/04/2012 5:58 PM

## 2012-03-05 LAB — GLUCOSE, CAPILLARY: Glucose-Capillary: 155 mg/dL — ABNORMAL HIGH (ref 70–99)

## 2012-03-05 LAB — BASIC METABOLIC PANEL
BUN: 18 mg/dL (ref 6–23)
CO2: 26 mEq/L (ref 19–32)
Chloride: 95 mEq/L — ABNORMAL LOW (ref 96–112)
GFR calc non Af Amer: 35 mL/min — ABNORMAL LOW (ref >90)
Glucose, Bld: 280 mg/dL — ABNORMAL HIGH (ref 70–99)
Potassium: 4.3 mEq/L (ref 3.5–5.1)
Sodium: 131 mEq/L — ABNORMAL LOW (ref 135–145)

## 2012-03-05 MED ORDER — INSULIN GLARGINE 100 UNIT/ML ~~LOC~~ SOLN
10.0000 [IU] | Freq: Every day | SUBCUTANEOUS | Status: DC
  Administered 2012-03-05: 10 [IU] via SUBCUTANEOUS

## 2012-03-05 NOTE — Progress Notes (Signed)
Inpatient Diabetes Program Recommendations  AACE/ADA: New Consensus Statement on Inpatient Glycemic Control (2013)  Target Ranges:  Prepandial:   less than 140 mg/dL      Peak postprandial:   less than 180 mg/dL (1-2 hours)      Critically ill patients:  140 - 180 mg/dL   Results for FELIX, MERAS (MRN [id number]) as of 03/05/2012 14:07  Ref. Range 03/04/2012 13:58 03/05/2012 04:50  Glucose Latest Range: 70-99 mg/dL 213 (H) 086 (H)    Inpatient Diabetes Program Recommendations Correction (SSI): Please order CBGs ACHS with Novolog moderate correction scale. HgbA1C: Please order an A1C since patient has a history of diabetes and there are not any A1C results in patient's chart.  Note: According to the patient's chart, he takes Amaryl 2mg  QAM and Metformin 1000mg  BID as an outpatient for diabetes management.  Currently patient has Amaryl 2mg  PO QAM ordered for glycemic control.  Blood glucose on labs for 12/2 was 244 mg/dl and for 57/8 was 469 mg/dl.  Please order blood glucose checks ACHS with Novolog moderate correction scale and an A1C.  Will continue to follow.  Thanks, Orlando Penner, RN, BSN, CCRN Diabetes Coordinator Inpatient Diabetes Program [phone]

## 2012-03-05 NOTE — Progress Notes (Signed)
Subjective:  Somewhat improved. Elevated blood sugars off metformin for renal dysfunction.  Objective:  Vital Signs in the last 24 hours: Temp:  [97.7 F (36.5 C)-99.3 F (37.4 C)] 99.3 F (37.4 C) (12/03 1630) Pulse Rate:  [70-81] 74  (12/03 1630) Cardiac Rhythm:  [-]  Resp:  [18-20] 20  (12/03 1630) BP: (170-186)/(81-90) 186/83 mmHg (12/03 1630) SpO2:  [98 %-99 %] 98 % (12/03 1630)  Physical Exam: BP Readings from Last 1 Encounters:  03/05/12 186/83    Wt Readings from Last 1 Encounters:  03/04/12 86.7 kg (191 lb 2.2 oz)    Weight change:   HEENT: [place]/AT, Eyes-Brown, PERL, EOMI, Conjunctiva-Pink, Sclera-Non-icteric Neck: No JVD, No bruit, Trachea midline. Lungs:  Clear, Bilateral. Cardiac:  Regular rhythm, normal S1 and S2, no S3.  Abdomen:  Soft, non-tender. Extremities:  No edema present. No cyanosis. No clubbing. CNS: AxOx3, Cranial nerves grossly intact, moves all 4 extremities. Right handed. Skin: Warm and dry.   Intake/Output from previous day: 12/02 0701 - 12/03 0700 In: 1658.3 [P.O.:60; I.V.:1598.3] Out: 500 [Urine:500]    Lab Results: BMET    Component Value Date/Time   NA 131* 03/05/2012 0450   K 4.3 03/05/2012 0450   CL 95* 03/05/2012 0450   CO2 26 03/05/2012 0450   GLUCOSE 280* 03/05/2012 0450   BUN 18 03/05/2012 0450   CREATININE 1.78* 03/05/2012 0450   CALCIUM 9.0 03/05/2012 0450   GFRNONAA 35* 03/05/2012 0450   GFRAA 41* 03/05/2012 0450   CBC    Component Value Date/Time   WBC 7.9 03/04/2012 1358   RBC 4.15* 03/04/2012 1358   HGB 12.9* 03/04/2012 1358   HCT 37.9* 03/04/2012 1358   PLT 193 03/04/2012 1358   MCV 91.3 03/04/2012 1358   MCH 31.1 03/04/2012 1358   MCHC 34.0 03/04/2012 1358   RDW 12.6 03/04/2012 1358   LYMPHSABS 2.0 03/04/2012 1358   MONOABS 0.7 03/04/2012 1358   EOSABS 0.1 03/04/2012 1358   BASOSABS 0.0 03/04/2012 1358   CARDIAC ENZYMES No results found for this basename: CKTOTAL, CKMB, CKMBINDEX, TROPONINI    Assessment/Plan:  Patient  Active Hospital Problem List: Abdominal pain-improving Right kidney stone DM, II Hypertension CKD, II  Lab work in AM Home in AM if stable.    LOS: 1 day    Orpah Cobb  MD  03/05/2012, 7:15 PM

## 2012-03-05 NOTE — Progress Notes (Signed)
  Echocardiogram 2D Echocardiogram has been performed.  Jen Eppinger 03/05/2012, 4:14 PM

## 2012-03-05 NOTE — Progress Notes (Signed)
   CARE MANAGEMENT NOTE 03/05/2012  Patient:  LYNKIN, SAINI   Account Number:  [id number]  Date Initiated:  03/05/2012  Documentation initiated by:  Jiles Crocker  Subjective/Objective Assessment:   ADMITTED WITH ABDOMINAL PAIN     Action/Plan:   PCP IS DR Malvin Johns WITH [facility]   Anticipated DC Date:  03/07/2012   Anticipated DC Plan:  HOME/SELF CARE      DC Planning Services  CM consult              Status of service:  In process, will continue to follow Medicare Important Message given?  NA - LOS <3 / Initial given by admissions (If response is "NO", the following Medicare IM given date fields will be blank)  Per UR Regulation:  Reviewed for med. necessity/level of care/duration of stay Comments:  03/25/2012- B Haidy Kackley RN,BSN,MHA

## 2012-03-06 LAB — CBC
Hemoglobin: 11.9 g/dL — ABNORMAL LOW (ref 13.0–17.0)
MCV: 92.4 fL (ref 78.0–100.0)
Platelets: 166 10*3/uL (ref 150–400)
RBC: 3.69 MIL/uL — ABNORMAL LOW (ref 4.22–5.81)
WBC: 8.4 10*3/uL (ref 4.0–10.5)

## 2012-03-06 LAB — GLUCOSE, CAPILLARY: Glucose-Capillary: 224 mg/dL — ABNORMAL HIGH (ref 70–99)

## 2012-03-06 LAB — BASIC METABOLIC PANEL
CO2: 24 mEq/L (ref 19–32)
Chloride: 97 mEq/L (ref 96–112)
Creatinine, Ser: 1.81 mg/dL — ABNORMAL HIGH (ref 0.50–1.35)
Glucose, Bld: 183 mg/dL — ABNORMAL HIGH (ref 70–99)
Sodium: 131 mEq/L — ABNORMAL LOW (ref 135–145)

## 2012-03-06 MED ORDER — SIMVASTATIN 40 MG PO TABS: 20.0000 mg | ORAL_TABLET | Freq: Every evening | ORAL | Status: DC

## 2012-03-06 MED ORDER — DSS 100 MG PO CAPS: 100.0000 mg | ORAL_CAPSULE | Freq: Two times a day (BID) | ORAL | Status: AC

## 2012-03-06 MED ORDER — AMLODIPINE BESYLATE 5 MG PO TABS: 5.0000 mg | ORAL_TABLET | Freq: Once | ORAL | Status: DC

## 2012-03-06 MED ORDER — HYDROCODONE-ACETAMINOPHEN 5-325 MG PO TABS: 1.0000 | ORAL_TABLET | Freq: Three times a day (TID) | ORAL | Status: DC | PRN

## 2012-03-06 MED ORDER — AMLODIPINE BESYLATE 2.5 MG PO TABS
2.5000 mg | ORAL_TABLET | Freq: Once | ORAL | Status: AC
  Administered 2012-03-06: 2.5 mg via ORAL
  Filled 2012-03-06: qty 1

## 2012-03-06 NOTE — Progress Notes (Signed)
Notified Dr. Algie Coffer of high blood pressure 181/81. Will give one time order of amlodipine and monitor closely. Erskin Burnet RN

## 2012-03-06 NOTE — Progress Notes (Signed)
Patient is being discharged in stable condition. Education completed. VSS. Erskin Burnet RN

## 2012-03-06 NOTE — Discharge Summary (Signed)
Physician Discharge Summary  Patient ID: Skyler Carel MRN: [id number] DOB/AGE: Jul 24, 1933 78 y.o.  Admit date: 03/04/2012 Discharge date: 03/06/2012  Admission Diagnoses: Abdominal pain-improving  Right kidney stone  DM, II  Hypertension  CKD, II  Discharge Diagnoses:  Principle Problem:  * Abdominal pain due to Right kidney stone  DM, II  Hypertension  CKD, II Hyponatremia  Discharged Condition: good  Hospital Course: 78 years old male with 3 day history of abdominal pain and constipation, has right renal stone. Pain was severe and both flank areas. He responded to IV fluids and analgesics. He was discharged home in stable condition. Norvasc was added to decrease use of ARB along with ACE inhibitor. Metformin was also discontinued due to renal dysfunction.   Consults: None  Significant Diagnostic Studies: labs: Near normal CBC and eteltrolytes with elevated BUN/Cr of 18/1.79. CT abdomen and pelvis prior to admission was positive for right ureteric stone. Repeat UA was unremarkable.  Treatments: IV hydration and analgesic.  Discharge Exam: Blood pressure 181/81, pulse 66, temperature 98.6 F (37 C), temperature source Oral, resp. rate 18, height 5\' 7"  (1.702 m), weight 86.7 kg (191 lb 2.2 oz), SpO2 97.00%.   Disposition: 01-Home or Self Care HEENT: [place]/AT, Eyes-Brown, PERL, EOMI, Conjunctiva-Pink, Sclera-Non-icteric  Neck: No JVD, No bruit, Trachea midline.  Lungs: Clear, Bilateral.  Cardiac: Regular rhythm, normal S1 and S2, no S3.  Abdomen: Soft, non-tender.  Extremities: No edema present. No cyanosis. No clubbing.  CNS: AxOx3, Cranial nerves grossly intact, moves all 4 extremities. Right handed.  Skin: Warm and dry.     Medication List     As of 03/06/2012  3:45 PM    STOP taking these medications         metformin 1000 MG (OSM) 24 hr tablet   Commonly known as: FORTAMET      olmesartan 40 MG tablet   Commonly known as: BENICAR      TAKE these  medications         amLODipine 5 MG tablet   Commonly known as: NORVASC   Take 1 tablet (5 mg total) by mouth once.      carvedilol 25 MG tablet   Commonly known as: COREG   Take 25 mg by mouth 2 (two) times daily with a meal.      DSS 100 MG Caps   Take 100 mg by mouth 2 (two) times daily.      glimepiride 2 MG tablet   Commonly known as: AMARYL   Take 2 mg by mouth daily before breakfast.      guaifenesin 100 MG/5ML syrup   Commonly known as: ROBITUSSIN   Take 200 mg by mouth 3 (three) times daily as needed. cough      HYDROcodone-acetaminophen 5-325 MG per tablet   Commonly known as: NORCO/VICODIN   Take 1 tablet by mouth every 8 (eight) hours as needed for pain. Take 0.5 - 1 tablet every 6 hours as needed for severe pain      ramipril 10 MG capsule   Commonly known as: ALTACE   Take 10 mg by mouth 2 (two) times daily.      senna-docusate 8.6-50 MG per tablet   Commonly known as: Senokot-S   Take 2 tablets by mouth daily.      simethicone 80 MG chewable tablet   Commonly known as: MYLICON   Chew 80 mg by mouth every 6 (six) hours as needed. Gas pain      simvastatin  40 MG tablet   Commonly known as: ZOCOR   Take 0.5 tablets (20 mg total) by mouth every evening.      spironolactone 25 MG tablet   Commonly known as: ALDACTONE   Take 25 mg by mouth 2 (two) times daily.      Tamsulosin HCl 0.4 MG Caps   Commonly known as: FLOMAX   Take 1 capsule (0.4 mg total) by mouth daily.         SignedOrpah Cobb S 03/06/2012, 3:45 PM

## 2012-03-09 ENCOUNTER — Encounter (HOSPITAL_COMMUNITY): Payer: Self-pay | Admitting: *Deleted

## 2012-03-09 ENCOUNTER — Emergency Department (HOSPITAL_COMMUNITY): Payer: Medicare Other

## 2012-03-09 ENCOUNTER — Emergency Department (HOSPITAL_COMMUNITY)
Admission: EM | Admit: 2012-03-09 | Discharge: 2012-03-09 | Disposition: A | Discharge status: Home / Self Care | Payer: Medicare Other | Attending: Emergency Medicine | Admitting: Emergency Medicine

## 2012-03-09 DIAGNOSIS — I2510 Atherosclerotic heart disease of native coronary artery without angina pectoris: Secondary | ICD-10-CM | POA: Insufficient documentation

## 2012-03-09 DIAGNOSIS — K5900 Constipation, unspecified: Secondary | ICD-10-CM | POA: Insufficient documentation

## 2012-03-09 DIAGNOSIS — R300 Dysuria: Secondary | ICD-10-CM | POA: Insufficient documentation

## 2012-03-09 DIAGNOSIS — I10 Essential (primary) hypertension: Secondary | ICD-10-CM | POA: Insufficient documentation

## 2012-03-09 DIAGNOSIS — E119 Type 2 diabetes mellitus without complications: Secondary | ICD-10-CM | POA: Insufficient documentation

## 2012-03-09 DIAGNOSIS — Z79899 Other long term (current) drug therapy: Secondary | ICD-10-CM | POA: Insufficient documentation

## 2012-03-09 DIAGNOSIS — N23 Unspecified renal colic: Secondary | ICD-10-CM | POA: Insufficient documentation

## 2012-03-09 DIAGNOSIS — R350 Frequency of micturition: Secondary | ICD-10-CM | POA: Insufficient documentation

## 2012-03-09 LAB — CBC WITH DIFFERENTIAL/PLATELET
Eosinophils Relative: 2 % (ref 0–5)
HCT: 35.7 % — ABNORMAL LOW (ref 39.0–52.0)
Hemoglobin: 12.7 g/dL — ABNORMAL LOW (ref 13.0–17.0)
Lymphocytes Relative: 23 % (ref 12–46)
Lymphs Abs: 1.7 10*3/uL (ref 0.7–4.0)
MCV: 90.8 fL (ref 78.0–100.0)
Monocytes Relative: 11 % (ref 3–12)
Platelets: 226 10*3/uL (ref 150–400)
RBC: 3.93 MIL/uL — ABNORMAL LOW (ref 4.22–5.81)
WBC: 7.2 10*3/uL (ref 4.0–10.5)

## 2012-03-09 LAB — URINALYSIS, ROUTINE W REFLEX MICROSCOPIC
Glucose, UA: NEGATIVE mg/dL
Ketones, ur: NEGATIVE mg/dL
Leukocytes, UA: NEGATIVE
pH: 6 (ref 5.0–8.0)

## 2012-03-09 LAB — POCT I-STAT, CHEM 8
BUN: 20 mg/dL (ref 6–23)
Calcium, Ion: 1.16 mmol/L (ref 1.13–1.30)
Chloride: 99 mEq/L (ref 96–112)
Sodium: 136 mEq/L (ref 135–145)

## 2012-03-09 LAB — URINE MICROSCOPIC-ADD ON

## 2012-03-09 MED ORDER — OXYCODONE-ACETAMINOPHEN 5-325 MG PO TABS: 1.0000 | ORAL_TABLET | ORAL | Status: AC | PRN

## 2012-03-09 MED ORDER — ONDANSETRON HCL 4 MG/2ML IJ SOLN
4.0000 mg | Freq: Once | INTRAMUSCULAR | Status: AC
  Administered 2012-03-09: 4 mg via INTRAVENOUS
  Filled 2012-03-09: qty 2

## 2012-03-09 MED ORDER — ONDANSETRON HCL 4 MG PO TABS: 4.0000 mg | ORAL_TABLET | Freq: Three times a day (TID) | ORAL | Status: DC | PRN

## 2012-03-09 MED ORDER — TAMSULOSIN HCL 0.4 MG PO CAPS: 0.4000 mg | ORAL_CAPSULE | Freq: Every day | ORAL | Status: AC

## 2012-03-09 MED ORDER — HYDROMORPHONE HCL PF 1 MG/ML IJ SOLN
1.0000 mg | Freq: Once | INTRAMUSCULAR | Status: AC
  Administered 2012-03-09: 1 mg via INTRAVENOUS
  Filled 2012-03-09: qty 1

## 2012-03-09 MED ORDER — SODIUM CHLORIDE 0.9 % IV SOLN
Freq: Once | INTRAVENOUS | Status: AC
  Administered 2012-03-09: 20 mL/h via INTRAVENOUS

## 2012-03-09 MED ORDER — MORPHINE SULFATE 4 MG/ML IJ SOLN
4.0000 mg | Freq: Once | INTRAMUSCULAR | Status: AC
  Administered 2012-03-09: 4 mg via INTRAVENOUS
  Filled 2012-03-09: qty 1

## 2012-03-09 NOTE — ED Provider Notes (Signed)
History     CSN: [id number]  Arrival date & time 03/09/12  1029   First MD Initiated Contact with Patient 03/09/12 1059      Chief Complaint  Patient presents with  . Flank Pain    (Consider location/radiation/quality/duration/timing/severity/associated sxs/prior treatment) HPI Comments: Patient with known 2mm right ureteral stone and constipation, recently admitted to hospital for same, presents today for uncontrolled pain.  Pain is located in right flank, radiates to RLQ, described as sharp, 10/10 intensity.  Associated burning and stinging with urination, urinary frequency.  Has had bowel movement yesterday but still c/o constipation.  Is taking vicodin 1 tab Q 8hrs prn pain and pain is uncontrolled.  Has been taking flomax but is now out (last dose today).  Denies fevers, N/V, hematuria, testicular pain or swelling.  Pt has not contacted the urologist as they were not aware of the referral on their discharge papers from the ED.   Patient is a 78 y.o. male presenting with flank pain. The history is provided by the patient, medical records and a relative.  Flank Pain Pertinent negatives include no chills, fever, myalgias, nausea or vomiting.    Past Medical History  Diagnosis Date  . Diabetes mellitus without complication   . Hypertension   . Coronary artery disease     History reviewed. No pertinent past surgical history.  No family history on file.  History  Substance Use Topics  . Smoking status: Never Smoker   . Smokeless tobacco: Current User    Types: Chew  . Alcohol Use: No      Review of Systems  Constitutional: Negative for fever and chills.  Gastrointestinal: Positive for constipation. Negative for nausea and vomiting.  Genitourinary: Positive for dysuria, frequency and flank pain. Negative for scrotal swelling and testicular pain.  Musculoskeletal: Negative for myalgias.    Allergies  Review of patient's allergies indicates no known allergies.  Home  Medications   Current Outpatient Rx  Name  Route  Sig  Dispense  Refill  . AMLODIPINE BESYLATE 5 MG PO TABS   Oral   Take 5 mg by mouth once.         Marland Kitchen CARVEDILOL 25 MG PO TABS   Oral   Take 25 mg by mouth 2 (two) times daily with a meal.         . DSS 100 MG PO CAPS   Oral   Take 100 mg by mouth 2 (two) times daily.         Marland Kitchen GLIMEPIRIDE 2 MG PO TABS   Oral   Take 2 mg by mouth daily before breakfast.         . HYDROCODONE-ACETAMINOPHEN 5-325 MG PO TABS   Oral   Take 1 tablet by mouth every 8 (eight) hours as needed. Take 0.5 - 1 tablet every 6 hours as needed for severe pain         . RAMIPRIL 10 MG PO CAPS   Oral   Take 10 mg by mouth 2 (two) times daily.         Marland Kitchen SAXAGLIPTIN HCL 2.5 MG PO TABS   Oral   Take 2.5 mg by mouth daily.         Bernadette Hoit SODIUM 8.6-50 MG PO TABS   Oral   Take 2 tablets by mouth daily.         Marland Kitchen SIMETHICONE 80 MG PO CHEW   Oral   Chew 80 mg by mouth every 6 (  six) hours as needed. Gas pain         . SIMVASTATIN 40 MG PO TABS   Oral   Take 20 mg by mouth every evening.         Marland Kitchen SPIRONOLACTONE 25 MG PO TABS   Oral   Take 25 mg by mouth 2 (two) times daily.         Marland Kitchen TAMSULOSIN HCL 0.4 MG PO CAPS   Oral   Take 1 capsule (0.4 mg total) by mouth daily.   7 capsule   0     BP 147/72  Pulse 76  Temp 97.8 F (36.6 C) (Oral)  Resp 18  Ht 5\' 6"  (1.676 m)  Wt 196 lb (88.905 kg)  BMI 31.64 kg/m2  SpO2 99%  Physical Exam  Nursing note and vitals reviewed. Constitutional: He appears well-developed and well-nourished. No distress.  HENT:  Head: Normocephalic and atraumatic.  Neck: Neck supple.  Cardiovascular: Normal rate and regular rhythm.   Pulmonary/Chest: Effort normal and breath sounds normal. No respiratory distress. He has no wheezes. He has no rales.  Abdominal: Soft. He exhibits no distension and no mass. There is tenderness. There is CVA tenderness. There is no rebound and no  guarding.       Diffuse right lower abdominal tenderness.  Right CVA tenderness.    Neurological: He is alert. He exhibits normal muscle tone.  Skin: He is not diaphoretic.    ED Course  Procedures (including critical care time)  Labs Reviewed  CBC WITH DIFFERENTIAL - Abnormal; Notable for the following:    RBC 3.93 (*)     Hemoglobin 12.7 (*)     HCT 35.7 (*)     All other components within normal limits  URINALYSIS, ROUTINE W REFLEX MICROSCOPIC - Abnormal; Notable for the following:    Hgb urine dipstick SMALL (*)     All other components within normal limits  POCT I-STAT, CHEM 8 - Abnormal; Notable for the following:    Creatinine, Ser 1.90 (*)     Hemoglobin 12.9 (*)     HCT 38.0 (*)     All other components within normal limits  URINE MICROSCOPIC-ADD ON  URINE CULTURE   US Renal  03/09/2012  *RADIOLOGY REPORT*  Clinical Data: 78 year old male with right flank pain. History of urinary calculi.  RENAL/URINARY TRACT ULTRASOUND COMPLETE  Comparison:  03/01/2012 CT  Findings:  Right Kidney:  There is mild right hydronephrosis. The right kidney is normal in size and echogenicity measuring 11.7 cm. There is no evidence of solid mass or definite renal calculi.  Left Kidney:  The left kidney is normal in echogenicity and size measuring 10.7 cm.  There is no evidence of hydronephrosis, solid renal mass or definite renal calculi.  Bladder:  A 4 mm calculus is identified at the right UVJ. The bladder is otherwise unremarkable.  IMPRESSION: 4 mm right UVJ calculus with mild right hydronephrosis.  Unremarkable left kidney.   Original Report Authenticated By: Harmon Pier, M.D.     11:37 AM Dr Patria Mane made aware of the patient.   3:25 PM Patient now resting comfortably, pain relieved.  Plan is for d/c home.  Discussed all results and treatment plan, follow up with patient and family.    1. Ureteral colic     MDM  Pt with known right ureteral stone, presenting today for pain control.  Pt has  been taking vicodin 5-325 q 8 hrs for pain.  I believe the  reason for his pain is simply underdosing of medication and location of stone at UVJ.  Pt also with constipation, though he did have recent bowel movement and is taking stool softener at home.  Pt without fever, UA does not appear to be infected (sent for culture), renal function very slightly up from last visit but renal US shows only mild right hydronephrosis.  Pt's pain well controlled in ED.  Discussed all results and treatment plan.  Encouraged to continue straining urine. Family now clear that they need to follow up with urology and also attempt to stay ahead of the pain.  Same urologist given to whom they were referred last week.  I had a long discussion with patient and family regarding safety with pain medications - both the meds containing acetaminophen and also with using narcotics safely.  All verbalize understanding and agree with plan.  Pt given return precautions.  Pt verbalizes understanding and agrees with plan.           [place], [place] 03/09/12 1701

## 2012-03-09 NOTE — ED Notes (Signed)
Pt presents with R sided flank pain unchanged since being discharged from hospital with kidney stone Wednesday. Denies hematuria. Sts pain in R flank radiates to RLQ.

## 2012-03-09 NOTE — ED Notes (Signed)
[place], PA at bedside. 

## 2012-03-09 NOTE — ED Notes (Signed)
Amber from [place] at [facility] called and stated that she is en route to see pt

## 2012-03-10 LAB — URINE CULTURE

## 2012-03-10 NOTE — ED Provider Notes (Signed)
Medical screening examination/treatment/procedure(s) were performed by non-physician practitioner and as supervising physician I was immediately available for consultation/collaboration.   Nithya Meriweather M Davinia Riccardi, MD 03/10/12 2132 

## 2012-08-05 ENCOUNTER — Encounter (HOSPITAL_COMMUNITY): Payer: Self-pay

## 2012-08-05 ENCOUNTER — Emergency Department (HOSPITAL_COMMUNITY)
Admission: EM | Admit: 2012-08-05 | Discharge: 2012-08-05 | Disposition: A | Discharge status: Home / Self Care | Payer: Medicare Other | Attending: Emergency Medicine | Admitting: Emergency Medicine

## 2012-08-05 DIAGNOSIS — F17200 Nicotine dependence, unspecified, uncomplicated: Secondary | ICD-10-CM | POA: Insufficient documentation

## 2012-08-05 DIAGNOSIS — E119 Type 2 diabetes mellitus without complications: Secondary | ICD-10-CM | POA: Insufficient documentation

## 2012-08-05 DIAGNOSIS — I2510 Atherosclerotic heart disease of native coronary artery without angina pectoris: Secondary | ICD-10-CM | POA: Insufficient documentation

## 2012-08-05 DIAGNOSIS — I10 Essential (primary) hypertension: Secondary | ICD-10-CM | POA: Insufficient documentation

## 2012-08-05 DIAGNOSIS — M549 Dorsalgia, unspecified: Secondary | ICD-10-CM | POA: Insufficient documentation

## 2012-08-05 DIAGNOSIS — M79605 Pain in left leg: Secondary | ICD-10-CM

## 2012-08-05 DIAGNOSIS — Z79899 Other long term (current) drug therapy: Secondary | ICD-10-CM | POA: Insufficient documentation

## 2012-08-05 DIAGNOSIS — M79609 Pain in unspecified limb: Secondary | ICD-10-CM | POA: Insufficient documentation

## 2012-08-05 MED ORDER — HYDROCODONE-ACETAMINOPHEN 5-325 MG PO TABS: 1.0000 | ORAL_TABLET | Freq: Four times a day (QID) | ORAL | Status: AC | PRN

## 2012-08-05 MED ORDER — METHOCARBAMOL 500 MG PO TABS: 500.0000 mg | ORAL_TABLET | Freq: Two times a day (BID) | ORAL | Status: AC

## 2012-08-05 MED ORDER — PROMETHAZINE HCL 25 MG PO TABS: 25.0000 mg | ORAL_TABLET | Freq: Four times a day (QID) | ORAL | Status: AC | PRN

## 2012-08-05 NOTE — ED Notes (Signed)
Pt left with prescriptions after d/c instructions given and walked to d/c window.

## 2012-08-05 NOTE — ED Provider Notes (Signed)
History     CSN: [id number]  Arrival date & time 08/05/12  4098   First MD Initiated Contact with Patient 08/05/12 1014      Chief Complaint  Patient presents with  . Leg Pain    (Consider location/radiation/quality/duration/timing/severity/associated sxs/prior treatment) HPI Comments: The patient is a 79 year old male who presents today with continued leg pain after he dropped a brick on his leg 2 weeks ago. He has been seen by his PCP for this issue. His PCP gave him pain medication and put him on an antibiotic which he does not remember the name of. He states he has had no improvement in the pain which is sharp and radiating up his leg. He does say that the wound caused by the brick has improved, but is not healed. PMH significant for diabetes. No fevers, chill, nausea, vomiting, abdominal pain, shortness of breath, chest pain, weakness, numbness. No bowel or bladder incontinence, hx of ca, ivda.   The history is provided by the patient. No language interpreter was used.    Past Medical History  Diagnosis Date  . Diabetes mellitus without complication   . Hypertension   . Coronary artery disease     History reviewed. No pertinent past surgical history.  History reviewed. No pertinent family history.  History  Substance Use Topics  . Smoking status: Never Smoker   . Smokeless tobacco: Current User    Types: Chew  . Alcohol Use: No      Review of Systems  Constitutional: Negative for fever and chills.  Respiratory: Negative for shortness of breath.   Cardiovascular: Negative for chest pain.  Gastrointestinal: Negative for nausea, vomiting and abdominal pain.  Musculoskeletal: Positive for myalgias, back pain and arthralgias.  Skin: Positive for wound.  All other systems reviewed and are negative.    Allergies  Review of patient's allergies indicates no known allergies.  Home Medications   Current Outpatient Rx  Name  Route  Sig  Dispense  Refill  .  amLODipine (NORVASC) 5 MG tablet   Oral   Take 5 mg by mouth once.         . carvedilol (COREG) 25 MG tablet   Oral   Take 25 mg by mouth 2 (two) times daily with a meal.         . docusate sodium 100 MG CAPS   Oral   Take 100 mg by mouth 2 (two) times daily.         Marland Kitchen glimepiride (AMARYL) 2 MG tablet   Oral   Take 2 mg by mouth daily before breakfast.         . ondansetron (ZOFRAN) 4 MG tablet   Oral   Take 1 tablet (4 mg total) by mouth every 8 (eight) hours as needed for nausea.   12 tablet   0   . oxyCODONE-acetaminophen (PERCOCET/ROXICET) 5-325 MG per tablet   Oral   Take 1 tablet by mouth every 4 (four) hours as needed for pain.   15 tablet   0   . ramipril (ALTACE) 10 MG capsule   Oral   Take 10 mg by mouth 2 (two) times daily.         . saxagliptin HCl (ONGLYZA) 2.5 MG TABS tablet   Oral   Take 2.5 mg by mouth daily.         Marland Kitchen senna-docusate (SENOKOT-S) 8.6-50 MG per tablet   Oral   Take 2 tablets by mouth daily.         Marland Kitchen  simethicone (MYLICON) 80 MG chewable tablet   Oral   Chew 80 mg by mouth every 6 (six) hours as needed. Gas pain         . simvastatin (ZOCOR) 40 MG tablet   Oral   Take 20 mg by mouth every evening.         Marland Kitchen spironolactone (ALDACTONE) 25 MG tablet   Oral   Take 25 mg by mouth 2 (two) times daily.         . Tamsulosin HCl (FLOMAX) 0.4 MG CAPS   Oral   Take 1 capsule (0.4 mg total) by mouth daily.   7 capsule   0   . Tamsulosin HCl (FLOMAX) 0.4 MG CAPS   Oral   Take 1 capsule (0.4 mg total) by mouth daily.   30 capsule   0     BP 180/81  Pulse 77  Temp(Src) 97.6 F (36.4 C) (Oral)  Resp 14  SpO2 99%  Physical Exam  Nursing note and vitals reviewed. Constitutional: He is oriented to person, place, and time. He appears well-developed and well-nourished. No distress.  HENT:  Head: Normocephalic and atraumatic.  Right Ear: External ear normal.  Left Ear: External ear normal.  Nose: Nose normal.   Eyes: Conjunctivae are normal.  Neck: Normal range of motion. No tracheal deviation present.  Cardiovascular: Normal rate, regular rhythm and normal heart sounds.   Pulmonary/Chest: Effort normal and breath sounds normal. No stridor.  Abdominal: Soft. He exhibits no distension. There is no tenderness.  Musculoskeletal: Normal range of motion.       Cervical back: He exhibits no tenderness, no bony tenderness and no deformity.       Thoracic back: He exhibits no tenderness, no bony tenderness and no deformity.       Lumbar back: He exhibits no tenderness, no bony tenderness and no deformity.  Muscular tenderness on left side of low back    Neurological: He is alert and oriented to person, place, and time.  Skin: Skin is warm and dry. He is not diaphoretic.  2 cm well healing wound on mid anterior lower leg - no erythema, streaking, swelling, drainage  Psychiatric: He has a normal mood and affect. His behavior is normal.    ED Course  Procedures (including critical care time)  Labs Reviewed - No data to display No results found.   1. Leg pain, left       MDM  Patient presents with left leg pain x 2 weeks. He was given abx by PCP which appears to be working for a wound sustained after dropping a brick on his leg. He now additionally has back pain. I believe this could be due to the fact that he has had an antalgic gait x 2 weeks and strained a muscle in his back. Muscle relaxer given. Pain medication given for leg pain. Discussed the importance of making a follow up appointment with PCP this week. No concern for osteomyelitis, fracture. No red flags such as bowel or bladder incontinence, hx of cancer, or ivda. Return instructions given. Vital signs stable for discharge. Patient / Family / Caregiver informed of clinical course, understand medical decision-making process, and agree with plan.         Mora Bellman, PA-C 08/07/12 1042

## 2012-08-05 NOTE — ED Notes (Signed)
Patient reports that a brick fell ontohis left shin and is now having pain from left him down entire left leg. No drainage or swelling noted.

## 2012-08-07 NOTE — ED Provider Notes (Signed)
Medical screening examination/treatment/procedure(s) were performed by non-physician practitioner and as supervising physician I was immediately available for consultation/collaboration.   Shaley Leavens E Dontasia Miranda, MD 08/07/12 1115 

## 2014-04-20 DIAGNOSIS — N189 Chronic kidney disease, unspecified: Secondary | ICD-10-CM | POA: Diagnosis not present

## 2014-04-20 DIAGNOSIS — I509 Heart failure, unspecified: Secondary | ICD-10-CM | POA: Diagnosis not present

## 2014-04-20 DIAGNOSIS — E119 Type 2 diabetes mellitus without complications: Secondary | ICD-10-CM | POA: Diagnosis not present

## 2014-04-20 DIAGNOSIS — I10 Essential (primary) hypertension: Secondary | ICD-10-CM | POA: Diagnosis not present

## 2014-04-20 DIAGNOSIS — E785 Hyperlipidemia, unspecified: Secondary | ICD-10-CM | POA: Diagnosis not present

## 2014-04-29 DIAGNOSIS — I10 Essential (primary) hypertension: Secondary | ICD-10-CM | POA: Diagnosis not present

## 2014-04-29 DIAGNOSIS — Z125 Encounter for screening for malignant neoplasm of prostate: Secondary | ICD-10-CM | POA: Diagnosis not present

## 2014-04-29 DIAGNOSIS — E118 Type 2 diabetes mellitus with unspecified complications: Secondary | ICD-10-CM | POA: Diagnosis not present

## 2014-04-29 DIAGNOSIS — R5383 Other fatigue: Secondary | ICD-10-CM | POA: Diagnosis not present

## 2014-04-29 DIAGNOSIS — E780 Pure hypercholesterolemia: Secondary | ICD-10-CM | POA: Diagnosis not present

## 2014-06-01 DIAGNOSIS — H25043 Posterior subcapsular polar age-related cataract, bilateral: Secondary | ICD-10-CM | POA: Diagnosis not present

## 2014-06-01 DIAGNOSIS — Z961 Presence of intraocular lens: Secondary | ICD-10-CM | POA: Diagnosis not present

## 2014-06-01 DIAGNOSIS — H2513 Age-related nuclear cataract, bilateral: Secondary | ICD-10-CM | POA: Diagnosis not present

## 2014-06-01 DIAGNOSIS — E119 Type 2 diabetes mellitus without complications: Secondary | ICD-10-CM | POA: Diagnosis not present

## 2014-06-01 DIAGNOSIS — H25013 Cortical age-related cataract, bilateral: Secondary | ICD-10-CM | POA: Diagnosis not present

## 2014-08-04 DIAGNOSIS — N189 Chronic kidney disease, unspecified: Secondary | ICD-10-CM | POA: Diagnosis not present

## 2014-08-04 DIAGNOSIS — E119 Type 2 diabetes mellitus without complications: Secondary | ICD-10-CM | POA: Diagnosis not present

## 2014-08-04 DIAGNOSIS — I509 Heart failure, unspecified: Secondary | ICD-10-CM | POA: Diagnosis not present

## 2014-08-04 DIAGNOSIS — I2510 Atherosclerotic heart disease of native coronary artery without angina pectoris: Secondary | ICD-10-CM | POA: Diagnosis not present

## 2014-08-04 DIAGNOSIS — I10 Essential (primary) hypertension: Secondary | ICD-10-CM | POA: Diagnosis not present

## 2014-08-04 DIAGNOSIS — E785 Hyperlipidemia, unspecified: Secondary | ICD-10-CM | POA: Diagnosis not present

## 2014-12-04 DIAGNOSIS — E119 Type 2 diabetes mellitus without complications: Secondary | ICD-10-CM | POA: Diagnosis not present

## 2014-12-04 DIAGNOSIS — N189 Chronic kidney disease, unspecified: Secondary | ICD-10-CM | POA: Diagnosis not present

## 2014-12-04 DIAGNOSIS — M1990 Unspecified osteoarthritis, unspecified site: Secondary | ICD-10-CM | POA: Diagnosis not present

## 2014-12-04 DIAGNOSIS — I5020 Unspecified systolic (congestive) heart failure: Secondary | ICD-10-CM | POA: Diagnosis not present

## 2014-12-04 DIAGNOSIS — I10 Essential (primary) hypertension: Secondary | ICD-10-CM | POA: Diagnosis not present

## 2014-12-04 DIAGNOSIS — I2510 Atherosclerotic heart disease of native coronary artery without angina pectoris: Secondary | ICD-10-CM | POA: Diagnosis not present

## 2014-12-04 DIAGNOSIS — E785 Hyperlipidemia, unspecified: Secondary | ICD-10-CM | POA: Diagnosis not present

## 2014-12-28 DIAGNOSIS — H11002 Unspecified pterygium of left eye: Secondary | ICD-10-CM | POA: Diagnosis not present

## 2014-12-28 DIAGNOSIS — H04123 Dry eye syndrome of bilateral lacrimal glands: Secondary | ICD-10-CM | POA: Diagnosis not present

## 2015-04-01 DIAGNOSIS — I1310 Hypertensive heart and chronic kidney disease without heart failure, with stage 1 through stage 4 chronic kidney disease, or unspecified chronic kidney disease: Secondary | ICD-10-CM | POA: Diagnosis not present

## 2015-04-01 DIAGNOSIS — E119 Type 2 diabetes mellitus without complications: Secondary | ICD-10-CM | POA: Diagnosis not present

## 2015-04-01 DIAGNOSIS — N189 Chronic kidney disease, unspecified: Secondary | ICD-10-CM | POA: Diagnosis not present

## 2015-04-01 DIAGNOSIS — E785 Hyperlipidemia, unspecified: Secondary | ICD-10-CM | POA: Diagnosis not present

## 2015-04-01 DIAGNOSIS — I2510 Atherosclerotic heart disease of native coronary artery without angina pectoris: Secondary | ICD-10-CM | POA: Diagnosis not present

## 2015-04-26 DIAGNOSIS — N189 Chronic kidney disease, unspecified: Secondary | ICD-10-CM | POA: Diagnosis not present

## 2015-04-26 DIAGNOSIS — I1310 Hypertensive heart and chronic kidney disease without heart failure, with stage 1 through stage 4 chronic kidney disease, or unspecified chronic kidney disease: Secondary | ICD-10-CM | POA: Diagnosis not present

## 2015-04-26 DIAGNOSIS — I2510 Atherosclerotic heart disease of native coronary artery without angina pectoris: Secondary | ICD-10-CM | POA: Diagnosis not present

## 2015-04-26 DIAGNOSIS — E785 Hyperlipidemia, unspecified: Secondary | ICD-10-CM | POA: Diagnosis not present

## 2015-04-26 DIAGNOSIS — M1990 Unspecified osteoarthritis, unspecified site: Secondary | ICD-10-CM | POA: Diagnosis not present

## 2015-04-26 DIAGNOSIS — J209 Acute bronchitis, unspecified: Secondary | ICD-10-CM | POA: Diagnosis not present

## 2015-09-01 DIAGNOSIS — N189 Chronic kidney disease, unspecified: Secondary | ICD-10-CM | POA: Diagnosis not present

## 2015-09-01 DIAGNOSIS — E785 Hyperlipidemia, unspecified: Secondary | ICD-10-CM | POA: Diagnosis not present

## 2015-09-01 DIAGNOSIS — E1140 Type 2 diabetes mellitus with diabetic neuropathy, unspecified: Secondary | ICD-10-CM | POA: Diagnosis not present

## 2015-09-01 DIAGNOSIS — R0602 Shortness of breath: Secondary | ICD-10-CM | POA: Diagnosis not present

## 2015-09-01 DIAGNOSIS — I2510 Atherosclerotic heart disease of native coronary artery without angina pectoris: Secondary | ICD-10-CM | POA: Diagnosis not present

## 2015-09-01 DIAGNOSIS — I1310 Hypertensive heart and chronic kidney disease without heart failure, with stage 1 through stage 4 chronic kidney disease, or unspecified chronic kidney disease: Secondary | ICD-10-CM | POA: Diagnosis not present

## 2015-09-01 DIAGNOSIS — E119 Type 2 diabetes mellitus without complications: Secondary | ICD-10-CM | POA: Diagnosis not present

## 2015-09-01 DIAGNOSIS — M1990 Unspecified osteoarthritis, unspecified site: Secondary | ICD-10-CM | POA: Diagnosis not present

## 2016-04-27 DIAGNOSIS — Z961 Presence of intraocular lens: Secondary | ICD-10-CM | POA: Diagnosis not present

## 2016-04-27 DIAGNOSIS — H04123 Dry eye syndrome of bilateral lacrimal glands: Secondary | ICD-10-CM | POA: Diagnosis not present

## 2016-04-27 DIAGNOSIS — H2512 Age-related nuclear cataract, left eye: Secondary | ICD-10-CM | POA: Diagnosis not present

## 2016-04-27 DIAGNOSIS — H25012 Cortical age-related cataract, left eye: Secondary | ICD-10-CM | POA: Diagnosis not present

## 2016-04-27 DIAGNOSIS — H524 Presbyopia: Secondary | ICD-10-CM | POA: Diagnosis not present

## 2016-04-27 DIAGNOSIS — E119 Type 2 diabetes mellitus without complications: Secondary | ICD-10-CM | POA: Diagnosis not present

## 2016-06-07 DIAGNOSIS — H25012 Cortical age-related cataract, left eye: Secondary | ICD-10-CM | POA: Diagnosis not present

## 2016-06-07 DIAGNOSIS — H2512 Age-related nuclear cataract, left eye: Secondary | ICD-10-CM | POA: Diagnosis not present

## 2016-06-22 DIAGNOSIS — I10 Essential (primary) hypertension: Secondary | ICD-10-CM | POA: Diagnosis not present

## 2016-06-22 DIAGNOSIS — A6009 Herpesviral infection of other urogenital tract: Secondary | ICD-10-CM | POA: Diagnosis not present

## 2016-06-22 DIAGNOSIS — N480 Leukoplakia of penis: Secondary | ICD-10-CM | POA: Diagnosis not present

## 2016-06-22 DIAGNOSIS — E119 Type 2 diabetes mellitus without complications: Secondary | ICD-10-CM | POA: Diagnosis not present

## 2016-07-10 ENCOUNTER — Other Ambulatory Visit: Payer: Self-pay | Admitting: Cardiology

## 2016-07-10 DIAGNOSIS — E119 Type 2 diabetes mellitus without complications: Secondary | ICD-10-CM | POA: Diagnosis not present

## 2016-07-10 DIAGNOSIS — M1990 Unspecified osteoarthritis, unspecified site: Secondary | ICD-10-CM | POA: Diagnosis not present

## 2016-07-10 DIAGNOSIS — E785 Hyperlipidemia, unspecified: Secondary | ICD-10-CM | POA: Diagnosis not present

## 2016-07-10 DIAGNOSIS — N189 Chronic kidney disease, unspecified: Secondary | ICD-10-CM | POA: Diagnosis not present

## 2016-07-10 DIAGNOSIS — E1140 Type 2 diabetes mellitus with diabetic neuropathy, unspecified: Secondary | ICD-10-CM | POA: Diagnosis not present

## 2016-07-10 DIAGNOSIS — I25119 Atherosclerotic heart disease of native coronary artery with unspecified angina pectoris: Secondary | ICD-10-CM | POA: Diagnosis not present

## 2016-07-10 DIAGNOSIS — I129 Hypertensive chronic kidney disease with stage 1 through stage 4 chronic kidney disease, or unspecified chronic kidney disease: Secondary | ICD-10-CM | POA: Diagnosis not present

## 2016-08-13 DIAGNOSIS — E785 Hyperlipidemia, unspecified: Secondary | ICD-10-CM | POA: Diagnosis not present

## 2016-08-13 DIAGNOSIS — E1140 Type 2 diabetes mellitus with diabetic neuropathy, unspecified: Secondary | ICD-10-CM | POA: Diagnosis not present

## 2016-08-13 DIAGNOSIS — I129 Hypertensive chronic kidney disease with stage 1 through stage 4 chronic kidney disease, or unspecified chronic kidney disease: Secondary | ICD-10-CM | POA: Diagnosis not present

## 2016-08-13 DIAGNOSIS — E1122 Type 2 diabetes mellitus with diabetic chronic kidney disease: Secondary | ICD-10-CM | POA: Diagnosis not present

## 2016-08-13 DIAGNOSIS — I25110 Atherosclerotic heart disease of native coronary artery with unstable angina pectoris: Secondary | ICD-10-CM | POA: Diagnosis not present

## 2016-08-13 DIAGNOSIS — M1990 Unspecified osteoarthritis, unspecified site: Secondary | ICD-10-CM | POA: Diagnosis not present

## 2016-08-13 DIAGNOSIS — N189 Chronic kidney disease, unspecified: Secondary | ICD-10-CM | POA: Diagnosis not present

## 2016-10-19 DIAGNOSIS — Z961 Presence of intraocular lens: Secondary | ICD-10-CM | POA: Diagnosis not present

## 2016-10-23 DIAGNOSIS — E119 Type 2 diabetes mellitus without complications: Secondary | ICD-10-CM | POA: Diagnosis not present

## 2016-10-23 DIAGNOSIS — I25118 Atherosclerotic heart disease of native coronary artery with other forms of angina pectoris: Secondary | ICD-10-CM | POA: Diagnosis not present

## 2016-10-23 DIAGNOSIS — N189 Chronic kidney disease, unspecified: Secondary | ICD-10-CM | POA: Diagnosis not present

## 2016-10-23 DIAGNOSIS — M1990 Unspecified osteoarthritis, unspecified site: Secondary | ICD-10-CM | POA: Diagnosis not present

## 2016-10-23 DIAGNOSIS — E785 Hyperlipidemia, unspecified: Secondary | ICD-10-CM | POA: Diagnosis not present

## 2016-10-23 DIAGNOSIS — I129 Hypertensive chronic kidney disease with stage 1 through stage 4 chronic kidney disease, or unspecified chronic kidney disease: Secondary | ICD-10-CM | POA: Diagnosis not present

## 2016-10-23 DIAGNOSIS — N529 Male erectile dysfunction, unspecified: Secondary | ICD-10-CM | POA: Diagnosis not present

## 2016-10-23 DIAGNOSIS — E1140 Type 2 diabetes mellitus with diabetic neuropathy, unspecified: Secondary | ICD-10-CM | POA: Diagnosis not present

## 2016-12-20 DIAGNOSIS — I10 Essential (primary) hypertension: Secondary | ICD-10-CM | POA: Diagnosis not present

## 2016-12-20 DIAGNOSIS — Z0000 Encounter for general adult medical examination without abnormal findings: Secondary | ICD-10-CM | POA: Diagnosis not present

## 2016-12-20 DIAGNOSIS — E7800 Pure hypercholesterolemia, unspecified: Secondary | ICD-10-CM | POA: Diagnosis not present

## 2016-12-20 DIAGNOSIS — E118 Type 2 diabetes mellitus with unspecified complications: Secondary | ICD-10-CM | POA: Diagnosis not present

## 2017-01-15 DIAGNOSIS — Z0000 Encounter for general adult medical examination without abnormal findings: Secondary | ICD-10-CM | POA: Diagnosis not present

## 2017-01-15 DIAGNOSIS — I10 Essential (primary) hypertension: Secondary | ICD-10-CM | POA: Diagnosis not present

## 2017-01-15 DIAGNOSIS — E118 Type 2 diabetes mellitus with unspecified complications: Secondary | ICD-10-CM | POA: Diagnosis not present

## 2017-05-14 ENCOUNTER — Other Ambulatory Visit: Payer: Self-pay | Admitting: Cardiology

## 2017-05-14 DIAGNOSIS — E1122 Type 2 diabetes mellitus with diabetic chronic kidney disease: Secondary | ICD-10-CM | POA: Diagnosis not present

## 2017-05-14 DIAGNOSIS — M1990 Unspecified osteoarthritis, unspecified site: Secondary | ICD-10-CM | POA: Diagnosis not present

## 2017-05-14 DIAGNOSIS — I129 Hypertensive chronic kidney disease with stage 1 through stage 4 chronic kidney disease, or unspecified chronic kidney disease: Secondary | ICD-10-CM | POA: Diagnosis not present

## 2017-05-14 DIAGNOSIS — E1140 Type 2 diabetes mellitus with diabetic neuropathy, unspecified: Secondary | ICD-10-CM | POA: Diagnosis not present

## 2017-05-14 DIAGNOSIS — E785 Hyperlipidemia, unspecified: Secondary | ICD-10-CM | POA: Diagnosis not present

## 2017-05-14 DIAGNOSIS — I25110 Atherosclerotic heart disease of native coronary artery with unstable angina pectoris: Secondary | ICD-10-CM | POA: Diagnosis not present

## 2017-05-14 DIAGNOSIS — R079 Chest pain, unspecified: Secondary | ICD-10-CM

## 2017-05-14 DIAGNOSIS — N189 Chronic kidney disease, unspecified: Secondary | ICD-10-CM | POA: Diagnosis not present

## 2017-06-04 ENCOUNTER — Encounter (HOSPITAL_COMMUNITY): Payer: Medicare HMO

## 2017-06-25 ENCOUNTER — Ambulatory Visit (HOSPITAL_COMMUNITY)
Admission: RE | Admit: 2017-06-25 | Discharge: 2017-06-25 | Disposition: A | Discharge status: Home / Self Care | Payer: Medicare HMO | Source: Ambulatory Visit | Attending: Cardiology | Admitting: Cardiology

## 2017-06-25 DIAGNOSIS — E785 Hyperlipidemia, unspecified: Secondary | ICD-10-CM | POA: Diagnosis not present

## 2017-06-25 DIAGNOSIS — M1990 Unspecified osteoarthritis, unspecified site: Secondary | ICD-10-CM | POA: Diagnosis not present

## 2017-06-25 DIAGNOSIS — E1122 Type 2 diabetes mellitus with diabetic chronic kidney disease: Secondary | ICD-10-CM | POA: Diagnosis not present

## 2017-06-25 DIAGNOSIS — I129 Hypertensive chronic kidney disease with stage 1 through stage 4 chronic kidney disease, or unspecified chronic kidney disease: Secondary | ICD-10-CM | POA: Diagnosis not present

## 2017-06-25 DIAGNOSIS — I25110 Atherosclerotic heart disease of native coronary artery with unstable angina pectoris: Secondary | ICD-10-CM | POA: Diagnosis not present

## 2017-06-25 DIAGNOSIS — R079 Chest pain, unspecified: Secondary | ICD-10-CM | POA: Insufficient documentation

## 2017-06-25 DIAGNOSIS — N189 Chronic kidney disease, unspecified: Secondary | ICD-10-CM | POA: Diagnosis not present

## 2017-06-25 DIAGNOSIS — E1140 Type 2 diabetes mellitus with diabetic neuropathy, unspecified: Secondary | ICD-10-CM | POA: Diagnosis not present

## 2017-06-25 MED ORDER — TECHNETIUM TC 99M TETROFOSMIN IV KIT
30.0000 | PACK | Freq: Once | INTRAVENOUS | Status: AC | PRN
  Administered 2017-06-25: 30 via INTRAVENOUS

## 2017-06-25 MED ORDER — REGADENOSON 0.4 MG/5ML IV SOLN
INTRAVENOUS | Status: AC
  Administered 2017-06-25: 0.4 mg via INTRAVENOUS
  Filled 2017-06-25: qty 5

## 2017-06-25 MED ORDER — TECHNETIUM TC 99M TETROFOSMIN IV KIT
10.0000 | PACK | Freq: Once | INTRAVENOUS | Status: AC | PRN
  Administered 2017-06-25: 10 via INTRAVENOUS

## 2017-06-25 MED ORDER — REGADENOSON 0.4 MG/5ML IV SOLN
0.4000 mg | Freq: Once | INTRAVENOUS | Status: AC
  Administered 2017-06-25: 0.4 mg via INTRAVENOUS

## 2017-08-13 DIAGNOSIS — I129 Hypertensive chronic kidney disease with stage 1 through stage 4 chronic kidney disease, or unspecified chronic kidney disease: Secondary | ICD-10-CM | POA: Diagnosis not present

## 2017-08-13 DIAGNOSIS — E785 Hyperlipidemia, unspecified: Secondary | ICD-10-CM | POA: Diagnosis not present

## 2017-08-13 DIAGNOSIS — M1990 Unspecified osteoarthritis, unspecified site: Secondary | ICD-10-CM | POA: Diagnosis not present

## 2017-08-13 DIAGNOSIS — E1140 Type 2 diabetes mellitus with diabetic neuropathy, unspecified: Secondary | ICD-10-CM | POA: Diagnosis not present

## 2017-08-13 DIAGNOSIS — N189 Chronic kidney disease, unspecified: Secondary | ICD-10-CM | POA: Diagnosis not present

## 2017-08-13 DIAGNOSIS — E1122 Type 2 diabetes mellitus with diabetic chronic kidney disease: Secondary | ICD-10-CM | POA: Diagnosis not present

## 2017-08-13 DIAGNOSIS — I25110 Atherosclerotic heart disease of native coronary artery with unstable angina pectoris: Secondary | ICD-10-CM | POA: Diagnosis not present

## 2017-08-16 ENCOUNTER — Encounter (HOSPITAL_COMMUNITY): Payer: Self-pay | Admitting: Emergency Medicine

## 2017-08-16 ENCOUNTER — Ambulatory Visit (HOSPITAL_COMMUNITY)
Admission: EM | Admit: 2017-08-16 | Discharge: 2017-08-16 | Disposition: A | Discharge status: Home / Self Care | Payer: Medicare HMO | Attending: Family Medicine | Admitting: Family Medicine

## 2017-08-16 ENCOUNTER — Other Ambulatory Visit: Payer: Self-pay

## 2017-08-16 DIAGNOSIS — Z202 Contact with and (suspected) exposure to infections with a predominantly sexual mode of transmission: Secondary | ICD-10-CM

## 2017-08-16 DIAGNOSIS — Z113 Encounter for screening for infections with a predominantly sexual mode of transmission: Secondary | ICD-10-CM | POA: Insufficient documentation

## 2017-08-16 MED ORDER — AZITHROMYCIN 250 MG PO TABS
1000.0000 mg | ORAL_TABLET | Freq: Once | ORAL | Status: AC
  Administered 2017-08-16: 1000 mg via ORAL

## 2017-08-16 MED ORDER — AZITHROMYCIN 250 MG PO TABS
ORAL_TABLET | ORAL | Status: AC
  Filled 2017-08-16: qty 4

## 2017-08-16 MED ORDER — CEFTRIAXONE SODIUM 250 MG IJ SOLR
INTRAMUSCULAR | Status: AC
  Filled 2017-08-16: qty 250

## 2017-08-16 MED ORDER — CEFTRIAXONE SODIUM 250 MG IJ SOLR
250.0000 mg | Freq: Once | INTRAMUSCULAR | Status: AC
  Administered 2017-08-16: 250 mg via INTRAMUSCULAR

## 2017-08-16 NOTE — ED Triage Notes (Signed)
Pt handed me a note stating he was told to be tested for Chlamydia and Gonorrhea.  States he was intimate with this person about a week or two ago.  Pt denies any symptoms.

## 2017-08-16 NOTE — ED Provider Notes (Signed)
[facility]   [id number] 08/16/17 Arrival Time: 1359  ASSESSMENT & PLAN:  1. Screen for STD (sexually transmitted disease)    Discharge Instructions     You have been given the following medications today for treatment of suspected gonorrhea and/or chlamydia:  cefTRIAXone (ROCEPHIN) injection 250 mg azithromycin (ZITHROMAX) tablet 1,000 mg  Even though we have treated you today, we have sent testing for sexually transmitted infections. We will notify you of any positive results once they are received. If required, we will prescribe any medications you might need.  Please refrain from all sexual activity for at least the next seven days.  Pending: Labs Reviewed  URINE CYTOLOGY ANCILLARY ONLY   Will notify of any positive results. Instructed to refrain from sexual activity for at least seven days.  Reviewed expectations re: course of current medical issues. Questions answered. Outlined signs and symptoms indicating need for more acute intervention. Patient verbalized understanding. After Visit Summary given.   SUBJECTIVE:  Kurt Moran is a 84 y.o. male who reports possible exposure to either gonorrhea or chlamydia. New male sexual partner. No penile discharge. Urinary symptoms: none. Afebrile. No abdominal or pelvic pain. No n/v. No rashes or lesions. Sexually active with single male partner. No h/o STD reported.  ROS: As per HPI.  OBJECTIVE:  Vitals:   08/16/17 1424  BP: (!) 141/59  Pulse: 66  Temp: 98.4 F (36.9 C)  TempSrc: Oral  SpO2: 100%    General appearance: alert, cooperative, appears stated age and no distress Throat: lips, mucosa, and tongue normal; teeth and gums normal Back: no CVA tenderness Abdomen: soft, non-tender GU: declines Skin: warm and dry Psychological:  Alert and cooperative. Normal mood and affect.  Labs Reviewed  URINE CYTOLOGY ANCILLARY ONLY   No Known Allergies  Past Medical History:  Diagnosis Date  .  Coronary artery disease   . Diabetes mellitus without complication (HCC)   . Hypertension    History reviewed. No pertinent family history. Social History   Socioeconomic History  . Marital status: Married    Spouse name: Not on file  . Number of children: Not on file  . Years of education: Not on file  . Highest education level: Not on file  Occupational History  . Not on file  Social Needs  . Financial resource strain: Not on file  . Food insecurity:    Worry: Not on file    Inability: Not on file  . Transportation needs:    Medical: Not on file    Non-medical: Not on file  Tobacco Use  . Smoking status: Never Smoker  . Smokeless tobacco: Current User    Types: Chew  Substance and Sexual Activity  . Alcohol use: No  . Drug use: No  . Sexual activity: Never  Lifestyle  . Physical activity:    Days per week: Not on file    Minutes per session: Not on file  . Stress: Not on file  Relationships  . Social connections:    Talks on phone: Not on file    Gets together: Not on file    Attends religious service: Not on file    Active member of club or organization: Not on file    Attends meetings of clubs or organizations: Not on file    Relationship status: Not on file  . Intimate partner violence:    Fear of current or ex partner: Not on file    Emotionally abused: Not on file  Physically abused: Not on file    Forced sexual activity: Not on file  Other Topics Concern  . Not on file  Social History Narrative  . Not on file          Mardella Layman, MD 08/16/17 [phone]

## 2017-08-16 NOTE — Discharge Instructions (Signed)

## 2017-08-17 LAB — URINE CYTOLOGY ANCILLARY ONLY
CHLAMYDIA, DNA PROBE: NEGATIVE
Neisseria Gonorrhea: NEGATIVE
Trichomonas: NEGATIVE

## 2017-08-20 ENCOUNTER — Telehealth (HOSPITAL_COMMUNITY): Payer: Self-pay

## 2017-08-20 NOTE — Telephone Encounter (Signed)
STD screening is negative. Attempted to reach patient. No answer at this time.

## 2017-10-10 DIAGNOSIS — R9439 Abnormal result of other cardiovascular function study: Secondary | ICD-10-CM | POA: Diagnosis not present

## 2017-10-10 DIAGNOSIS — I10 Essential (primary) hypertension: Secondary | ICD-10-CM | POA: Diagnosis not present

## 2017-10-10 DIAGNOSIS — I2510 Atherosclerotic heart disease of native coronary artery without angina pectoris: Secondary | ICD-10-CM | POA: Diagnosis not present

## 2017-10-10 DIAGNOSIS — E785 Hyperlipidemia, unspecified: Secondary | ICD-10-CM | POA: Diagnosis not present

## 2017-11-21 DIAGNOSIS — E119 Type 2 diabetes mellitus without complications: Secondary | ICD-10-CM | POA: Diagnosis not present

## 2017-11-21 DIAGNOSIS — I10 Essential (primary) hypertension: Secondary | ICD-10-CM | POA: Diagnosis not present

## 2017-11-21 DIAGNOSIS — E785 Hyperlipidemia, unspecified: Secondary | ICD-10-CM | POA: Diagnosis not present

## 2017-11-26 DIAGNOSIS — I10 Essential (primary) hypertension: Secondary | ICD-10-CM | POA: Diagnosis not present

## 2017-11-26 DIAGNOSIS — M1990 Unspecified osteoarthritis, unspecified site: Secondary | ICD-10-CM | POA: Diagnosis not present

## 2017-11-26 DIAGNOSIS — E1140 Type 2 diabetes mellitus with diabetic neuropathy, unspecified: Secondary | ICD-10-CM | POA: Diagnosis not present

## 2017-11-26 DIAGNOSIS — I25118 Atherosclerotic heart disease of native coronary artery with other forms of angina pectoris: Secondary | ICD-10-CM | POA: Diagnosis not present

## 2017-11-26 DIAGNOSIS — N189 Chronic kidney disease, unspecified: Secondary | ICD-10-CM | POA: Diagnosis not present

## 2017-11-26 DIAGNOSIS — E785 Hyperlipidemia, unspecified: Secondary | ICD-10-CM | POA: Diagnosis not present

## 2018-03-05 DIAGNOSIS — M1990 Unspecified osteoarthritis, unspecified site: Secondary | ICD-10-CM | POA: Diagnosis not present

## 2018-03-05 DIAGNOSIS — E1122 Type 2 diabetes mellitus with diabetic chronic kidney disease: Secondary | ICD-10-CM | POA: Diagnosis not present

## 2018-03-05 DIAGNOSIS — N189 Chronic kidney disease, unspecified: Secondary | ICD-10-CM | POA: Diagnosis not present

## 2018-03-05 DIAGNOSIS — I10 Essential (primary) hypertension: Secondary | ICD-10-CM | POA: Diagnosis not present

## 2018-03-05 DIAGNOSIS — I25118 Atherosclerotic heart disease of native coronary artery with other forms of angina pectoris: Secondary | ICD-10-CM | POA: Diagnosis not present

## 2018-03-05 DIAGNOSIS — E785 Hyperlipidemia, unspecified: Secondary | ICD-10-CM | POA: Diagnosis not present

## 2018-06-14 DIAGNOSIS — I25118 Atherosclerotic heart disease of native coronary artery with other forms of angina pectoris: Secondary | ICD-10-CM | POA: Diagnosis not present

## 2018-06-14 DIAGNOSIS — E785 Hyperlipidemia, unspecified: Secondary | ICD-10-CM | POA: Diagnosis not present

## 2018-06-14 DIAGNOSIS — N189 Chronic kidney disease, unspecified: Secondary | ICD-10-CM | POA: Diagnosis not present

## 2018-06-14 DIAGNOSIS — I10 Essential (primary) hypertension: Secondary | ICD-10-CM | POA: Diagnosis not present

## 2018-06-14 DIAGNOSIS — E1122 Type 2 diabetes mellitus with diabetic chronic kidney disease: Secondary | ICD-10-CM | POA: Diagnosis not present

## 2018-06-14 DIAGNOSIS — M1990 Unspecified osteoarthritis, unspecified site: Secondary | ICD-10-CM | POA: Diagnosis not present

## 2018-09-20 DIAGNOSIS — M1990 Unspecified osteoarthritis, unspecified site: Secondary | ICD-10-CM | POA: Diagnosis not present

## 2018-09-20 DIAGNOSIS — E1122 Type 2 diabetes mellitus with diabetic chronic kidney disease: Secondary | ICD-10-CM | POA: Diagnosis not present

## 2018-09-20 DIAGNOSIS — I10 Essential (primary) hypertension: Secondary | ICD-10-CM | POA: Diagnosis not present

## 2018-09-20 DIAGNOSIS — N189 Chronic kidney disease, unspecified: Secondary | ICD-10-CM | POA: Diagnosis not present

## 2018-09-20 DIAGNOSIS — I25118 Atherosclerotic heart disease of native coronary artery with other forms of angina pectoris: Secondary | ICD-10-CM | POA: Diagnosis not present

## 2018-09-20 DIAGNOSIS — E785 Hyperlipidemia, unspecified: Secondary | ICD-10-CM | POA: Diagnosis not present

## 2019-01-03 DIAGNOSIS — N189 Chronic kidney disease, unspecified: Secondary | ICD-10-CM | POA: Diagnosis not present

## 2019-01-03 DIAGNOSIS — E119 Type 2 diabetes mellitus without complications: Secondary | ICD-10-CM | POA: Diagnosis not present

## 2019-01-03 DIAGNOSIS — K219 Gastro-esophageal reflux disease without esophagitis: Secondary | ICD-10-CM | POA: Diagnosis not present

## 2019-01-03 DIAGNOSIS — M1990 Unspecified osteoarthritis, unspecified site: Secondary | ICD-10-CM | POA: Diagnosis not present

## 2019-01-03 DIAGNOSIS — E785 Hyperlipidemia, unspecified: Secondary | ICD-10-CM | POA: Diagnosis not present

## 2019-01-03 DIAGNOSIS — I10 Essential (primary) hypertension: Secondary | ICD-10-CM | POA: Diagnosis not present

## 2019-01-03 DIAGNOSIS — N529 Male erectile dysfunction, unspecified: Secondary | ICD-10-CM | POA: Diagnosis not present

## 2019-01-03 DIAGNOSIS — I25119 Atherosclerotic heart disease of native coronary artery with unspecified angina pectoris: Secondary | ICD-10-CM | POA: Diagnosis not present

## 2019-04-14 DIAGNOSIS — N189 Chronic kidney disease, unspecified: Secondary | ICD-10-CM | POA: Diagnosis not present

## 2019-04-14 DIAGNOSIS — I208 Other forms of angina pectoris: Secondary | ICD-10-CM | POA: Diagnosis not present

## 2019-04-14 DIAGNOSIS — M1990 Unspecified osteoarthritis, unspecified site: Secondary | ICD-10-CM | POA: Diagnosis not present

## 2019-04-14 DIAGNOSIS — E785 Hyperlipidemia, unspecified: Secondary | ICD-10-CM | POA: Diagnosis not present

## 2019-04-14 DIAGNOSIS — E119 Type 2 diabetes mellitus without complications: Secondary | ICD-10-CM | POA: Diagnosis not present

## 2019-04-14 DIAGNOSIS — K219 Gastro-esophageal reflux disease without esophagitis: Secondary | ICD-10-CM | POA: Diagnosis not present

## 2019-04-14 DIAGNOSIS — I10 Essential (primary) hypertension: Secondary | ICD-10-CM | POA: Diagnosis not present

## 2019-06-02 ENCOUNTER — Ambulatory Visit: Payer: Medicare HMO | Attending: Internal Medicine

## 2019-06-02 DIAGNOSIS — Z23 Encounter for immunization: Secondary | ICD-10-CM | POA: Insufficient documentation

## 2019-06-02 NOTE — Progress Notes (Signed)
   Covid-19 Vaccination Clinic  Name:  Amirr Achord    MRN: [id number] DOB: 07-27-1933  06/02/2019  Mr. Burningham was observed post Covid-19 immunization for 15 minutes without incidence. He was provided with Vaccine Information Sheet and instruction to access the V-Safe system.   Mr. Eltringham was instructed to call 911 with any severe reactions post vaccine: Marland Kitchen Difficulty breathing  . Swelling of your face and throat  . A fast heartbeat  . A bad rash all over your body  . Dizziness and weakness    Immunizations Administered    Name Date Dose VIS Date Route   Pfizer COVID-19 Vaccine 06/02/2019  8:44 AM 0.3 mL 03/14/2019 Intramuscular   Manufacturer: [employer], [employer]   Lot: [id number]   NDC: [address]-1

## 2019-07-01 ENCOUNTER — Ambulatory Visit: Payer: Medicare HMO | Attending: Internal Medicine

## 2019-07-01 DIAGNOSIS — Z23 Encounter for immunization: Secondary | ICD-10-CM

## 2019-07-01 NOTE — Progress Notes (Signed)
   Covid-19 Vaccination Clinic  Name:  Cyrus Ramsburg    MRN: [id number] DOB: [birth year]  07/01/2019  Mr. Aldape was observed post Covid-19 immunization for 15 minutes without incident. He was provided with Vaccine Information Sheet and instruction to access the V-Safe system.   Mr. Couey was instructed to call 911 with any severe reactions post vaccine: Marland Kitchen Difficulty breathing  . Swelling of face and throat  . A fast heartbeat  . A bad rash all over body  . Dizziness and weakness   Immunizations Administered    Name Date Dose VIS Date Route   Pfizer COVID-19 Vaccine 07/01/2019  9:01 AM 0.3 mL 03/14/2019 Intramuscular   Manufacturer: [employer], [employer]   Lot: [id number]   NDC: [address]-9

## 2019-07-18 DIAGNOSIS — E785 Hyperlipidemia, unspecified: Secondary | ICD-10-CM | POA: Diagnosis not present

## 2019-07-18 DIAGNOSIS — I208 Other forms of angina pectoris: Secondary | ICD-10-CM | POA: Diagnosis not present

## 2019-07-18 DIAGNOSIS — I10 Essential (primary) hypertension: Secondary | ICD-10-CM | POA: Diagnosis not present

## 2019-07-18 DIAGNOSIS — E1122 Type 2 diabetes mellitus with diabetic chronic kidney disease: Secondary | ICD-10-CM | POA: Diagnosis not present

## 2019-07-18 DIAGNOSIS — K219 Gastro-esophageal reflux disease without esophagitis: Secondary | ICD-10-CM | POA: Diagnosis not present

## 2019-07-18 DIAGNOSIS — N189 Chronic kidney disease, unspecified: Secondary | ICD-10-CM | POA: Diagnosis not present

## 2019-07-18 DIAGNOSIS — E119 Type 2 diabetes mellitus without complications: Secondary | ICD-10-CM | POA: Diagnosis not present

## 2019-07-18 DIAGNOSIS — M1990 Unspecified osteoarthritis, unspecified site: Secondary | ICD-10-CM | POA: Diagnosis not present

## 2019-10-13 DIAGNOSIS — H524 Presbyopia: Secondary | ICD-10-CM | POA: Diagnosis not present

## 2019-10-13 DIAGNOSIS — E119 Type 2 diabetes mellitus without complications: Secondary | ICD-10-CM | POA: Diagnosis not present

## 2019-10-13 DIAGNOSIS — H52203 Unspecified astigmatism, bilateral: Secondary | ICD-10-CM | POA: Diagnosis not present

## 2019-10-13 DIAGNOSIS — Z961 Presence of intraocular lens: Secondary | ICD-10-CM | POA: Diagnosis not present

## 2019-11-14 DIAGNOSIS — E119 Type 2 diabetes mellitus without complications: Secondary | ICD-10-CM | POA: Diagnosis not present

## 2019-11-14 DIAGNOSIS — E785 Hyperlipidemia, unspecified: Secondary | ICD-10-CM | POA: Diagnosis not present

## 2019-11-14 DIAGNOSIS — N189 Chronic kidney disease, unspecified: Secondary | ICD-10-CM | POA: Diagnosis not present

## 2019-11-14 DIAGNOSIS — I10 Essential (primary) hypertension: Secondary | ICD-10-CM | POA: Diagnosis not present

## 2019-11-14 DIAGNOSIS — I208 Other forms of angina pectoris: Secondary | ICD-10-CM | POA: Diagnosis not present

## 2020-03-01 DIAGNOSIS — E7800 Pure hypercholesterolemia, unspecified: Secondary | ICD-10-CM | POA: Diagnosis not present

## 2020-03-01 DIAGNOSIS — I10 Essential (primary) hypertension: Secondary | ICD-10-CM | POA: Diagnosis not present

## 2020-03-01 DIAGNOSIS — E119 Type 2 diabetes mellitus without complications: Secondary | ICD-10-CM | POA: Diagnosis not present

## 2020-03-08 DIAGNOSIS — I10 Essential (primary) hypertension: Secondary | ICD-10-CM | POA: Diagnosis not present

## 2020-03-08 DIAGNOSIS — E119 Type 2 diabetes mellitus without complications: Secondary | ICD-10-CM | POA: Diagnosis not present

## 2020-03-08 DIAGNOSIS — I208 Other forms of angina pectoris: Secondary | ICD-10-CM | POA: Diagnosis not present

## 2020-03-08 DIAGNOSIS — E785 Hyperlipidemia, unspecified: Secondary | ICD-10-CM | POA: Diagnosis not present

## 2020-06-08 DIAGNOSIS — E119 Type 2 diabetes mellitus without complications: Secondary | ICD-10-CM | POA: Diagnosis not present

## 2020-06-08 DIAGNOSIS — N189 Chronic kidney disease, unspecified: Secondary | ICD-10-CM | POA: Diagnosis not present

## 2020-06-08 DIAGNOSIS — E785 Hyperlipidemia, unspecified: Secondary | ICD-10-CM | POA: Diagnosis not present

## 2020-06-08 DIAGNOSIS — I208 Other forms of angina pectoris: Secondary | ICD-10-CM | POA: Diagnosis not present

## 2020-06-08 DIAGNOSIS — I10 Essential (primary) hypertension: Secondary | ICD-10-CM | POA: Diagnosis not present

## 2020-09-27 DIAGNOSIS — E119 Type 2 diabetes mellitus without complications: Secondary | ICD-10-CM | POA: Diagnosis not present

## 2020-09-27 DIAGNOSIS — I10 Essential (primary) hypertension: Secondary | ICD-10-CM | POA: Diagnosis not present

## 2020-09-27 DIAGNOSIS — I208 Other forms of angina pectoris: Secondary | ICD-10-CM | POA: Diagnosis not present

## 2020-09-27 DIAGNOSIS — N189 Chronic kidney disease, unspecified: Secondary | ICD-10-CM | POA: Diagnosis not present

## 2020-09-27 DIAGNOSIS — E785 Hyperlipidemia, unspecified: Secondary | ICD-10-CM | POA: Diagnosis not present

## 2020-11-26 DIAGNOSIS — E782 Mixed hyperlipidemia: Secondary | ICD-10-CM | POA: Diagnosis not present

## 2020-11-26 DIAGNOSIS — I25118 Atherosclerotic heart disease of native coronary artery with other forms of angina pectoris: Secondary | ICD-10-CM | POA: Diagnosis not present

## 2020-11-26 DIAGNOSIS — I10 Essential (primary) hypertension: Secondary | ICD-10-CM | POA: Diagnosis not present

## 2020-11-26 DIAGNOSIS — E1165 Type 2 diabetes mellitus with hyperglycemia: Secondary | ICD-10-CM | POA: Diagnosis not present

## 2020-12-10 DIAGNOSIS — I25118 Atherosclerotic heart disease of native coronary artery with other forms of angina pectoris: Secondary | ICD-10-CM | POA: Diagnosis not present

## 2020-12-10 DIAGNOSIS — I10 Essential (primary) hypertension: Secondary | ICD-10-CM | POA: Diagnosis not present

## 2020-12-10 DIAGNOSIS — E782 Mixed hyperlipidemia: Secondary | ICD-10-CM | POA: Diagnosis not present

## 2020-12-10 DIAGNOSIS — E1165 Type 2 diabetes mellitus with hyperglycemia: Secondary | ICD-10-CM | POA: Diagnosis not present

## 2021-01-03 DIAGNOSIS — I10 Essential (primary) hypertension: Secondary | ICD-10-CM | POA: Diagnosis not present

## 2021-01-03 DIAGNOSIS — E785 Hyperlipidemia, unspecified: Secondary | ICD-10-CM | POA: Diagnosis not present

## 2021-01-03 DIAGNOSIS — I208 Other forms of angina pectoris: Secondary | ICD-10-CM | POA: Diagnosis not present

## 2021-01-03 DIAGNOSIS — E119 Type 2 diabetes mellitus without complications: Secondary | ICD-10-CM | POA: Diagnosis not present

## 2021-02-11 DIAGNOSIS — I10 Essential (primary) hypertension: Secondary | ICD-10-CM | POA: Diagnosis not present

## 2021-02-11 DIAGNOSIS — E785 Hyperlipidemia, unspecified: Secondary | ICD-10-CM | POA: Diagnosis not present

## 2021-02-11 DIAGNOSIS — I208 Other forms of angina pectoris: Secondary | ICD-10-CM | POA: Diagnosis not present

## 2021-04-14 DIAGNOSIS — I10 Essential (primary) hypertension: Secondary | ICD-10-CM | POA: Diagnosis not present

## 2021-04-14 DIAGNOSIS — Z0000 Encounter for general adult medical examination without abnormal findings: Secondary | ICD-10-CM | POA: Diagnosis not present

## 2021-04-14 DIAGNOSIS — E1165 Type 2 diabetes mellitus with hyperglycemia: Secondary | ICD-10-CM | POA: Diagnosis not present

## 2021-04-14 DIAGNOSIS — E782 Mixed hyperlipidemia: Secondary | ICD-10-CM | POA: Diagnosis not present

## 2021-04-14 DIAGNOSIS — I25118 Atherosclerotic heart disease of native coronary artery with other forms of angina pectoris: Secondary | ICD-10-CM | POA: Diagnosis not present

## 2021-04-22 DIAGNOSIS — I10 Essential (primary) hypertension: Secondary | ICD-10-CM | POA: Diagnosis not present

## 2021-04-22 DIAGNOSIS — E782 Mixed hyperlipidemia: Secondary | ICD-10-CM | POA: Diagnosis not present

## 2021-04-22 DIAGNOSIS — Z0000 Encounter for general adult medical examination without abnormal findings: Secondary | ICD-10-CM | POA: Diagnosis not present

## 2021-04-22 DIAGNOSIS — E1165 Type 2 diabetes mellitus with hyperglycemia: Secondary | ICD-10-CM | POA: Diagnosis not present

## 2021-04-22 DIAGNOSIS — N1831 Chronic kidney disease, stage 3a: Secondary | ICD-10-CM | POA: Diagnosis not present

## 2021-04-22 DIAGNOSIS — R809 Proteinuria, unspecified: Secondary | ICD-10-CM | POA: Diagnosis not present

## 2021-05-18 DIAGNOSIS — I5020 Unspecified systolic (congestive) heart failure: Secondary | ICD-10-CM | POA: Diagnosis not present

## 2021-05-18 DIAGNOSIS — I208 Other forms of angina pectoris: Secondary | ICD-10-CM | POA: Diagnosis not present

## 2021-05-18 DIAGNOSIS — E1169 Type 2 diabetes mellitus with other specified complication: Secondary | ICD-10-CM | POA: Diagnosis not present

## 2021-05-18 DIAGNOSIS — I10 Essential (primary) hypertension: Secondary | ICD-10-CM | POA: Diagnosis not present

## 2021-06-02 DIAGNOSIS — I10 Essential (primary) hypertension: Secondary | ICD-10-CM | POA: Diagnosis not present

## 2021-06-02 DIAGNOSIS — E1169 Type 2 diabetes mellitus with other specified complication: Secondary | ICD-10-CM | POA: Diagnosis not present

## 2021-06-02 DIAGNOSIS — I5020 Unspecified systolic (congestive) heart failure: Secondary | ICD-10-CM | POA: Diagnosis not present

## 2021-06-02 DIAGNOSIS — I208 Other forms of angina pectoris: Secondary | ICD-10-CM | POA: Diagnosis not present

## 2021-06-13 DIAGNOSIS — H524 Presbyopia: Secondary | ICD-10-CM | POA: Diagnosis not present

## 2021-06-13 DIAGNOSIS — E119 Type 2 diabetes mellitus without complications: Secondary | ICD-10-CM | POA: Diagnosis not present

## 2021-06-13 DIAGNOSIS — Z7984 Long term (current) use of oral hypoglycemic drugs: Secondary | ICD-10-CM | POA: Diagnosis not present

## 2021-06-13 DIAGNOSIS — Z961 Presence of intraocular lens: Secondary | ICD-10-CM | POA: Diagnosis not present

## 2021-06-13 DIAGNOSIS — H52203 Unspecified astigmatism, bilateral: Secondary | ICD-10-CM | POA: Diagnosis not present

## 2021-08-22 DIAGNOSIS — I5020 Unspecified systolic (congestive) heart failure: Secondary | ICD-10-CM | POA: Diagnosis not present

## 2021-08-22 DIAGNOSIS — I10 Essential (primary) hypertension: Secondary | ICD-10-CM | POA: Diagnosis not present

## 2021-08-22 DIAGNOSIS — I208 Other forms of angina pectoris: Secondary | ICD-10-CM | POA: Diagnosis not present

## 2021-08-22 DIAGNOSIS — E785 Hyperlipidemia, unspecified: Secondary | ICD-10-CM | POA: Diagnosis not present

## 2021-08-22 DIAGNOSIS — E119 Type 2 diabetes mellitus without complications: Secondary | ICD-10-CM | POA: Diagnosis not present

## 2021-09-02 DIAGNOSIS — N1831 Chronic kidney disease, stage 3a: Secondary | ICD-10-CM | POA: Diagnosis not present

## 2021-09-02 DIAGNOSIS — E782 Mixed hyperlipidemia: Secondary | ICD-10-CM | POA: Diagnosis not present

## 2021-09-02 DIAGNOSIS — E1165 Type 2 diabetes mellitus with hyperglycemia: Secondary | ICD-10-CM | POA: Diagnosis not present

## 2021-09-02 DIAGNOSIS — R809 Proteinuria, unspecified: Secondary | ICD-10-CM | POA: Diagnosis not present

## 2021-09-02 DIAGNOSIS — I10 Essential (primary) hypertension: Secondary | ICD-10-CM | POA: Diagnosis not present

## 2021-11-28 DIAGNOSIS — I208 Other forms of angina pectoris: Secondary | ICD-10-CM | POA: Diagnosis not present

## 2021-11-28 DIAGNOSIS — I10 Essential (primary) hypertension: Secondary | ICD-10-CM | POA: Diagnosis not present

## 2021-11-28 DIAGNOSIS — E785 Hyperlipidemia, unspecified: Secondary | ICD-10-CM | POA: Diagnosis not present

## 2021-11-28 DIAGNOSIS — I5020 Unspecified systolic (congestive) heart failure: Secondary | ICD-10-CM | POA: Diagnosis not present

## 2021-11-28 DIAGNOSIS — E119 Type 2 diabetes mellitus without complications: Secondary | ICD-10-CM | POA: Diagnosis not present

## 2021-12-23 DIAGNOSIS — E1165 Type 2 diabetes mellitus with hyperglycemia: Secondary | ICD-10-CM | POA: Diagnosis not present

## 2021-12-23 DIAGNOSIS — E782 Mixed hyperlipidemia: Secondary | ICD-10-CM | POA: Diagnosis not present

## 2021-12-30 DIAGNOSIS — E782 Mixed hyperlipidemia: Secondary | ICD-10-CM | POA: Diagnosis not present

## 2021-12-30 DIAGNOSIS — I10 Essential (primary) hypertension: Secondary | ICD-10-CM | POA: Diagnosis not present

## 2021-12-30 DIAGNOSIS — N1831 Chronic kidney disease, stage 3a: Secondary | ICD-10-CM | POA: Diagnosis not present

## 2021-12-30 DIAGNOSIS — M5431 Sciatica, right side: Secondary | ICD-10-CM | POA: Diagnosis not present

## 2021-12-30 DIAGNOSIS — R809 Proteinuria, unspecified: Secondary | ICD-10-CM | POA: Diagnosis not present

## 2021-12-30 DIAGNOSIS — E1165 Type 2 diabetes mellitus with hyperglycemia: Secondary | ICD-10-CM | POA: Diagnosis not present

## 2022-05-10 DIAGNOSIS — Z0000 Encounter for general adult medical examination without abnormal findings: Secondary | ICD-10-CM | POA: Diagnosis not present

## 2022-05-10 DIAGNOSIS — I10 Essential (primary) hypertension: Secondary | ICD-10-CM | POA: Diagnosis not present

## 2022-05-10 DIAGNOSIS — N1831 Chronic kidney disease, stage 3a: Secondary | ICD-10-CM | POA: Diagnosis not present

## 2022-05-10 DIAGNOSIS — E1165 Type 2 diabetes mellitus with hyperglycemia: Secondary | ICD-10-CM | POA: Diagnosis not present

## 2022-05-10 DIAGNOSIS — M1990 Unspecified osteoarthritis, unspecified site: Secondary | ICD-10-CM | POA: Diagnosis not present

## 2022-05-10 DIAGNOSIS — R5383 Other fatigue: Secondary | ICD-10-CM | POA: Diagnosis not present

## 2022-05-10 DIAGNOSIS — E782 Mixed hyperlipidemia: Secondary | ICD-10-CM | POA: Diagnosis not present

## 2022-05-10 DIAGNOSIS — R809 Proteinuria, unspecified: Secondary | ICD-10-CM | POA: Diagnosis not present

## 2022-06-19 DIAGNOSIS — E119 Type 2 diabetes mellitus without complications: Secondary | ICD-10-CM | POA: Diagnosis not present

## 2022-06-19 DIAGNOSIS — H52203 Unspecified astigmatism, bilateral: Secondary | ICD-10-CM | POA: Diagnosis not present

## 2022-06-19 DIAGNOSIS — H524 Presbyopia: Secondary | ICD-10-CM | POA: Diagnosis not present

## 2022-06-19 DIAGNOSIS — Z961 Presence of intraocular lens: Secondary | ICD-10-CM | POA: Diagnosis not present

## 2022-06-19 DIAGNOSIS — Z7984 Long term (current) use of oral hypoglycemic drugs: Secondary | ICD-10-CM | POA: Diagnosis not present

## 2022-06-19 NOTE — Progress Notes (Signed)
 [facility] - [place] [address] [place] [address] [phone] Clinical Visit Note      CHIEF COMPLAINT Patient presents for Diabetic Eye Exam   HISTORY OF PRESENT ILLNESS: Kurt Moran is a 89 y.o. old male who presents to the clinic today for:   HPI     Diabetic Eye Exam   I, the attending physician, performed the HPI with the patient and updated the documentation appropriately.  The patient is here for a return visit.  The patient's treatment for diabetes includes oral medications.  The patient has been diabetic for years.  Last A1C:: Stable, unsure of number.  The patient reports stable vision.  The patient does not request a new glasses prescription.        Comments   Pt here for annual diabetic eye exam (Type 2, oral medications, last A1C unknown). Pt states vision both eyes is fair. He denies seeing floaters or FOL.      Last edited by April Dela Stager, COA on 06/19/2022  9:40 AM.      HISTORICAL INFORMATION:    CURRENT MEDICATIONS:  Current Outpatient Medications:  .  amLODIPine  (NORVASC ) 5 mg tablet, Take  by mouth., Disp: , Rfl:  .  atorvastatin (LIPITOR) 40 mg tablet, , Disp: , Rfl:  .  carvediloL  (COREG ) 25 mg tablet, Take  by mouth., Disp: , Rfl:  .  glimepiride  (AMARYL ) 2 mg tablet, , Disp: , Rfl:  .  metFORMIN  (GLUCOPHAGE ) 1,000 mg tablet, , Disp: , Rfl:  .  nitroglycerin (NITRO-DUR) 0.4 mg/hr patch, , Disp: , Rfl:  .  nitroglycerin (NITROSTAT) 0.4 mg SL tablet, , Disp: , Rfl:  .  ramipriL (ALTACE) 10 mg capsule, Take  by mouth., Disp: , Rfl:  .  sAXagliptin (ONGLYZA) 2.5 mg tablet, Take 2.5 mg by mouth daily., Disp: , Rfl:  .  spironolactone  (ALDACTONE ) 25 mg tablet, , Disp: , Rfl:  .  torsemide (DEMADEX) 20 mg tablet, Take 20 mg by mouth Once Daily., Disp: , Rfl:    Referring physician: No referring provider defined for this encounter.   ALLERGIES No Known Allergies  PAST MEDICAL  HISTORY Past Medical History:  Diagnosis Date  . Cataract   . Diabetes mellitus (CMS/HCC)    Type II  . Hypertension    Past Surgical History:  Procedure Laterality Date  . CATARACT EXTRACTION Right 2011   Procedure: CATARACT EXTRACTION  . CATARACT EXTRACTION W/  INTRAOCULAR LENS IMPLANT Left 06/07/2016   Procedure: CATARACT EXTRACTION W/  INTRAOCULAR LENS IMPLANT; SN60WF 21.0    FAMILY HISTORY Family History  Problem Relation Name Age of Onset  . Heart attack Sister    . Glaucoma Neg Hx    . Macular degeneration Neg Hx      SOCIAL HISTORY Social History   Tobacco Use  . Smoking status: Former  . Smokeless tobacco: Current  Substance Use Topics  . Alcohol use: No        OPHTHALMIC EXAM:  Base Eye Exam     Visual Acuity (Snellen - Linear)       Right Left   Dist cc 20/40 20/40    Correction: Glasses         Tonometry (Applanation: Fluress OU, 9:53 AM)       Right Left   Pressure 16 17         Pupils       Pupils   Right PERRL  Left PERRL         Visual Fields       Left Right    Full Full         Extraocular Movement       Right Left    Full, Ortho Full, Ortho         Neuro/Psych     Oriented x3: Yes   Mood/Affect: Normal         Dilation     Both eyes: 2.5% Phenylephrine, 1.0% Tropicamide @ 9:53 AM  Angles open by pen light.          Slit Lamp and Fundus Exam     External Exam       Right Left   External Normal Normal         Slit Lamp Exam       Right Left   Lids/Lashes Normal Normal   Conjunctiva/Sclera White and quiet White and quiet   Cornea Clear Clear   Anterior Chamber Deep and quiet Deep and quiet   Iris Round and reactive Round and reactive   Lens Posterior chamber intraocular lens Posterior chamber intraocular lens         Fundus Exam       Right Left   Vitreous Normal Normal   Disc Normal Normal   C/D Ratio 0.4 0.4   Macula Normal Normal   Vessels Normal, no Retinopathy  Normal, no Retinopathy   Periphery Normal Normal           Refraction     Wearing Rx       Sphere Cylinder Axis Add   Right -0.50 +1.50 150 +2.50   Left -1.25 +1.00 003 +2.50    Age: 35yrs   Type: Bifocal         Manifest Refraction       Sphere Cylinder Axis Dist [place] Add Near [place]   Right -0.50 +1.50 165 20/40 +2.50 J1   Left -1.25 +1.00 005 20/40 +2.50 J1         Final Rx       Sphere Cylinder Axis Dist VA Add [place]   Right -0.50 +1.50 165 20/40 +2.50 J1   Left -1.25 +1.00 005 20/40 +2.50 J1    Expiration Date: 06/19/2023            IMAGING AND PROCEDURES:       ASSESSMENT/PLAN:  1. Type 2 diabetes mellitus without complication, without long-term current use of insulin  (CMS/HCC)      2. Pseudophakia of both eyes      3. Astigmatism with presbyopia, bilateral        Ophthalmic Meds Ordered this visit:  No orders of the defined types were placed in this encounter.     Return for 1 year CE Diabetic and psph.  There are no Patient Instructions on file for this visit.   Type II DM  No diabetic retinopathy OU, continue to follow annually. We will send a note to the patients doctor.  Pseudophakic OU PC IOL's look good OU, continue to follow annually.  Copy of glasses RX given to patient.  Explained the diagnoses, plan, and follow up with the patient and they expressed understanding.  Patient expressed understanding of the importance of proper follow up care.    Abbreviations: M myopia (nearsighted); A astigmatism; H hyperopia (farsighted); P presbyopia; Mrx spectacle prescription;  CTL contact lenses; OD right eye; OS left eye; OU both eyes  XT exotropia; ET  esotropia; PEK punctate epithelial keratitis; PEE punctate epithelial erosions; DES dry eye syndrome; MGD meibomian gland dysfunction; ATs artificial tears; PFAT's preservative free artificial tears; NSC nuclear sclerotic cataract; PSC posterior subcapsular cataract; ERM epi-retinal  membrane; PVD posterior vitreous detachment; RD retinal detachment; DM diabetes mellitus; DR diabetic retinopathy; NPDR non-proliferative diabetic retinopathy; PDR proliferative diabetic retinopathy; CSME clinically significant macular edema; DME diabetic macular edema; dbh dot blot hemorrhages; CWS cotton wool spot; POAG primary open angle glaucoma; C/D cup-to-disc ratio; HVF humphrey visual field; GVF goldmann visual field; OCT optical coherence tomography; IOP intraocular pressure; BRVO Branch retinal vein occlusion; CRVO central retinal vein occlusion; CRAO central retinal artery occlusion; BRAO branch retinal artery occlusion; RT retinal tear; SB scleral buckle; PPV pars plana vitrectomy; VH Vitreous hemorrhage; PRP panretinal laser photocoagulation; IVK intravitreal kenalog; VMT vitreomacular traction; MH Macular hole;  NVD neovascularization of the disc; NVE neovascularization elsewhere; AREDS age related eye disease study; ARMD age related macular degeneration; POAG primary open angle glaucoma; EBMD epithelial/anterior basement membrane dystrophy; ACIOL anterior chamber intraocular lens; IOL intraocular lens; PCIOL posterior chamber intraocular lens; Phaco/IOL phacoemulsification with intraocular lens placement; PRK photorefractive keratectomy; LASIK laser assisted in situ keratomileusis; HTN hypertension; DM diabetes mellitus; COPD chronic obstructive pulmonary disease   This document serves as a record of services personally performed by Oneil ONEIDA Platts, MD.  It was created on their behalf by Gustav Stephane Acre, COA, a trained medical scribe, and Certified Ophthalmic Assistant (COA). During the course of documenting the history, physical exam and medical decision making, I was functioning as a Stage manager. The creation of this record is the provider's dictation and/or activities during the visit.  Electronically signed by Gustav Stephane Acre, COA 06/19/2022 10:22 AM I agree the documentation is  accurate and complete.  Electronically signed by: Oneil Platts, MD 06/19/2022 10:22 AM

## 2022-10-30 DIAGNOSIS — E1165 Type 2 diabetes mellitus with hyperglycemia: Secondary | ICD-10-CM | POA: Diagnosis not present

## 2022-10-30 DIAGNOSIS — E782 Mixed hyperlipidemia: Secondary | ICD-10-CM | POA: Diagnosis not present

## 2022-10-30 DIAGNOSIS — N1831 Chronic kidney disease, stage 3a: Secondary | ICD-10-CM | POA: Diagnosis not present

## 2022-10-30 DIAGNOSIS — Z0000 Encounter for general adult medical examination without abnormal findings: Secondary | ICD-10-CM | POA: Diagnosis not present

## 2022-10-30 DIAGNOSIS — I10 Essential (primary) hypertension: Secondary | ICD-10-CM | POA: Diagnosis not present

## 2022-10-30 DIAGNOSIS — R809 Proteinuria, unspecified: Secondary | ICD-10-CM | POA: Diagnosis not present

## 2022-11-06 DIAGNOSIS — E782 Mixed hyperlipidemia: Secondary | ICD-10-CM | POA: Diagnosis not present

## 2022-11-06 DIAGNOSIS — M1990 Unspecified osteoarthritis, unspecified site: Secondary | ICD-10-CM | POA: Diagnosis not present

## 2022-11-06 DIAGNOSIS — N1831 Chronic kidney disease, stage 3a: Secondary | ICD-10-CM | POA: Diagnosis not present

## 2022-11-06 DIAGNOSIS — I10 Essential (primary) hypertension: Secondary | ICD-10-CM | POA: Diagnosis not present

## 2022-11-06 DIAGNOSIS — E1122 Type 2 diabetes mellitus with diabetic chronic kidney disease: Secondary | ICD-10-CM | POA: Diagnosis not present

## 2022-11-06 DIAGNOSIS — R809 Proteinuria, unspecified: Secondary | ICD-10-CM | POA: Diagnosis not present

## 2022-12-05 DIAGNOSIS — H26491 Other secondary cataract, right eye: Secondary | ICD-10-CM | POA: Diagnosis not present

## 2022-12-05 DIAGNOSIS — E119 Type 2 diabetes mellitus without complications: Secondary | ICD-10-CM | POA: Diagnosis not present

## 2022-12-05 DIAGNOSIS — H04123 Dry eye syndrome of bilateral lacrimal glands: Secondary | ICD-10-CM | POA: Diagnosis not present

## 2022-12-05 DIAGNOSIS — H43821 Vitreomacular adhesion, right eye: Secondary | ICD-10-CM | POA: Diagnosis not present

## 2023-02-07 DIAGNOSIS — E1122 Type 2 diabetes mellitus with diabetic chronic kidney disease: Secondary | ICD-10-CM | POA: Diagnosis not present

## 2023-02-07 DIAGNOSIS — N1831 Chronic kidney disease, stage 3a: Secondary | ICD-10-CM | POA: Diagnosis not present

## 2023-02-07 DIAGNOSIS — I10 Essential (primary) hypertension: Secondary | ICD-10-CM | POA: Diagnosis not present

## 2023-02-07 DIAGNOSIS — E782 Mixed hyperlipidemia: Secondary | ICD-10-CM | POA: Diagnosis not present

## 2023-02-12 DIAGNOSIS — R809 Proteinuria, unspecified: Secondary | ICD-10-CM | POA: Diagnosis not present

## 2023-02-12 DIAGNOSIS — M1990 Unspecified osteoarthritis, unspecified site: Secondary | ICD-10-CM | POA: Diagnosis not present

## 2023-02-12 DIAGNOSIS — E1122 Type 2 diabetes mellitus with diabetic chronic kidney disease: Secondary | ICD-10-CM | POA: Diagnosis not present

## 2023-02-12 DIAGNOSIS — I10 Essential (primary) hypertension: Secondary | ICD-10-CM | POA: Diagnosis not present

## 2023-02-12 DIAGNOSIS — N1831 Chronic kidney disease, stage 3a: Secondary | ICD-10-CM | POA: Diagnosis not present

## 2023-02-12 DIAGNOSIS — E782 Mixed hyperlipidemia: Secondary | ICD-10-CM | POA: Diagnosis not present

## 2023-05-22 DIAGNOSIS — M1990 Unspecified osteoarthritis, unspecified site: Secondary | ICD-10-CM | POA: Diagnosis not present

## 2023-05-22 DIAGNOSIS — I10 Essential (primary) hypertension: Secondary | ICD-10-CM | POA: Diagnosis not present

## 2023-05-22 DIAGNOSIS — R809 Proteinuria, unspecified: Secondary | ICD-10-CM | POA: Diagnosis not present

## 2023-05-22 DIAGNOSIS — N1831 Chronic kidney disease, stage 3a: Secondary | ICD-10-CM | POA: Diagnosis not present

## 2023-05-22 DIAGNOSIS — E782 Mixed hyperlipidemia: Secondary | ICD-10-CM | POA: Diagnosis not present

## 2023-05-22 DIAGNOSIS — R5383 Other fatigue: Secondary | ICD-10-CM | POA: Diagnosis not present

## 2023-05-22 DIAGNOSIS — E1122 Type 2 diabetes mellitus with diabetic chronic kidney disease: Secondary | ICD-10-CM | POA: Diagnosis not present

## 2023-06-05 ENCOUNTER — Other Ambulatory Visit: Payer: Self-pay | Admitting: Internal Medicine

## 2023-06-05 DIAGNOSIS — R0989 Other specified symptoms and signs involving the circulatory and respiratory systems: Secondary | ICD-10-CM

## 2023-06-05 DIAGNOSIS — R809 Proteinuria, unspecified: Secondary | ICD-10-CM | POA: Diagnosis not present

## 2023-06-05 DIAGNOSIS — E1122 Type 2 diabetes mellitus with diabetic chronic kidney disease: Secondary | ICD-10-CM | POA: Diagnosis not present

## 2023-06-05 DIAGNOSIS — Z0000 Encounter for general adult medical examination without abnormal findings: Secondary | ICD-10-CM | POA: Diagnosis not present

## 2023-06-05 DIAGNOSIS — M5431 Sciatica, right side: Secondary | ICD-10-CM | POA: Diagnosis not present

## 2023-06-05 DIAGNOSIS — N1831 Chronic kidney disease, stage 3a: Secondary | ICD-10-CM | POA: Diagnosis not present

## 2023-06-05 DIAGNOSIS — E782 Mixed hyperlipidemia: Secondary | ICD-10-CM | POA: Diagnosis not present

## 2023-06-05 DIAGNOSIS — I10 Essential (primary) hypertension: Secondary | ICD-10-CM | POA: Diagnosis not present

## 2023-06-05 DIAGNOSIS — M1990 Unspecified osteoarthritis, unspecified site: Secondary | ICD-10-CM | POA: Diagnosis not present

## 2023-10-08 ENCOUNTER — Other Ambulatory Visit

## 2023-10-14 ENCOUNTER — Emergency Department (HOSPITAL_COMMUNITY)

## 2023-10-14 ENCOUNTER — Encounter (HOSPITAL_COMMUNITY): Payer: Self-pay | Admitting: Emergency Medicine

## 2023-10-14 ENCOUNTER — Emergency Department (HOSPITAL_COMMUNITY)
Admission: EM | Admit: 2023-10-14 | Discharge: 2023-10-14 | Disposition: A | Discharge status: Home / Self Care | Attending: Emergency Medicine | Admitting: Emergency Medicine

## 2023-10-14 ENCOUNTER — Other Ambulatory Visit: Payer: Self-pay

## 2023-10-14 DIAGNOSIS — Z79899 Other long term (current) drug therapy: Secondary | ICD-10-CM | POA: Diagnosis not present

## 2023-10-14 DIAGNOSIS — E119 Type 2 diabetes mellitus without complications: Secondary | ICD-10-CM | POA: Insufficient documentation

## 2023-10-14 DIAGNOSIS — M19071 Primary osteoarthritis, right ankle and foot: Secondary | ICD-10-CM | POA: Diagnosis not present

## 2023-10-14 DIAGNOSIS — M79671 Pain in right foot: Secondary | ICD-10-CM | POA: Diagnosis not present

## 2023-10-14 DIAGNOSIS — M25561 Pain in right knee: Secondary | ICD-10-CM | POA: Diagnosis not present

## 2023-10-14 DIAGNOSIS — S9031XA Contusion of right foot, initial encounter: Secondary | ICD-10-CM

## 2023-10-14 DIAGNOSIS — Z7984 Long term (current) use of oral hypoglycemic drugs: Secondary | ICD-10-CM | POA: Diagnosis not present

## 2023-10-14 DIAGNOSIS — I10 Essential (primary) hypertension: Secondary | ICD-10-CM | POA: Insufficient documentation

## 2023-10-14 DIAGNOSIS — N179 Acute kidney failure, unspecified: Secondary | ICD-10-CM | POA: Diagnosis not present

## 2023-10-14 DIAGNOSIS — R1110 Vomiting, unspecified: Secondary | ICD-10-CM | POA: Insufficient documentation

## 2023-10-14 DIAGNOSIS — M1711 Unilateral primary osteoarthritis, right knee: Secondary | ICD-10-CM | POA: Diagnosis not present

## 2023-10-14 DIAGNOSIS — R197 Diarrhea, unspecified: Secondary | ICD-10-CM | POA: Diagnosis not present

## 2023-10-14 DIAGNOSIS — M79604 Pain in right leg: Secondary | ICD-10-CM | POA: Diagnosis not present

## 2023-10-14 DIAGNOSIS — R112 Nausea with vomiting, unspecified: Secondary | ICD-10-CM

## 2023-10-14 LAB — CBC WITH DIFFERENTIAL/PLATELET
Abs Immature Granulocytes: 0.02 K/uL (ref 0.00–0.07)
Basophils Absolute: 0 K/uL (ref 0.0–0.1)
Basophils Relative: 0 %
Eosinophils Absolute: 0.1 K/uL (ref 0.0–0.5)
Eosinophils Relative: 1 %
HCT: 39.7 % (ref 39.0–52.0)
Hemoglobin: 12.9 g/dL — ABNORMAL LOW (ref 13.0–17.0)
Immature Granulocytes: 0 %
Lymphocytes Relative: 26 %
Lymphs Abs: 1.7 K/uL (ref 0.7–4.0)
MCH: 33.5 pg (ref 26.0–34.0)
MCHC: 32.5 g/dL (ref 30.0–36.0)
MCV: 103.1 fL — ABNORMAL HIGH (ref 80.0–100.0)
Monocytes Absolute: 0.7 K/uL (ref 0.1–1.0)
Monocytes Relative: 10 %
Neutro Abs: 4.1 K/uL (ref 1.7–7.7)
Neutrophils Relative %: 63 %
Platelets: 152 K/uL (ref 150–400)
RBC: 3.85 MIL/uL — ABNORMAL LOW (ref 4.22–5.81)
RDW: 14.6 % (ref 11.5–15.5)
WBC: 6.5 K/uL (ref 4.0–10.5)
nRBC: 0 % (ref 0.0–0.2)

## 2023-10-14 LAB — COMPREHENSIVE METABOLIC PANEL WITH GFR
ALT: 19 U/L (ref 0–44)
AST: 31 U/L (ref 15–41)
Albumin: 4.2 g/dL (ref 3.5–5.0)
Alkaline Phosphatase: 77 U/L (ref 38–126)
Anion gap: 9 (ref 5–15)
BUN: 36 mg/dL — ABNORMAL HIGH (ref 8–23)
CO2: 19 mmol/L — ABNORMAL LOW (ref 22–32)
Calcium: 9 mg/dL (ref 8.9–10.3)
Chloride: 101 mmol/L (ref 98–111)
Creatinine, Ser: 2.17 mg/dL — ABNORMAL HIGH (ref 0.61–1.24)
GFR, Estimated: 29 mL/min — ABNORMAL LOW (ref >60)
Glucose, Bld: 344 mg/dL — ABNORMAL HIGH (ref 70–99)
Potassium: 4.1 mmol/L (ref 3.5–5.1)
Sodium: 129 mmol/L — ABNORMAL LOW (ref 135–145)
Total Bilirubin: 1 mg/dL (ref 0.0–1.2)
Total Protein: 8 g/dL (ref 6.5–8.1)

## 2023-10-14 LAB — LIPASE, BLOOD: Lipase: 61 U/L — ABNORMAL HIGH (ref 11–51)

## 2023-10-14 MED ORDER — SODIUM CHLORIDE 0.9 % IV BOLUS
500.0000 mL | Freq: Once | INTRAVENOUS | Status: AC
  Administered 2023-10-14: 500 mL via INTRAVENOUS

## 2023-10-14 MED ORDER — ONDANSETRON HCL 4 MG PO TABS
4.0000 mg | ORAL_TABLET | Freq: Four times a day (QID) | ORAL | 0 refills | Status: AC
Start: 2023-10-14 — End: ?

## 2023-10-14 MED ORDER — TRAMADOL HCL 50 MG PO TABS: 50.0000 mg | ORAL_TABLET | Freq: Four times a day (QID) | ORAL | 0 refills | Status: AC | PRN

## 2023-10-14 MED ORDER — ONDANSETRON HCL 4 MG/2ML IJ SOLN
4.0000 mg | Freq: Once | INTRAMUSCULAR | Status: AC
  Administered 2023-10-14: 4 mg via INTRAVENOUS
  Filled 2023-10-14: qty 2

## 2023-10-14 MED ORDER — FENTANYL CITRATE PF 50 MCG/ML IJ SOSY
50.0000 ug | PREFILLED_SYRINGE | Freq: Once | INTRAMUSCULAR | Status: AC
  Administered 2023-10-14: 50 ug via INTRAVENOUS
  Filled 2023-10-14: qty 1

## 2023-10-14 NOTE — ED Triage Notes (Signed)
 Pt comes in after fall today. He had diarrhea and vomiting this morning. Then at 0840 had a fall. Right knee and pain right ankle pain.

## 2023-10-14 NOTE — ED Provider Notes (Signed)
[place] EMERGENCY DEPARTMENT AT [facility] Provider Note   CSN: [id number] Arrival date & time: 10/14/23  1812     Patient presents with: Kurt Moran is a 89 y.o. male history of hypertension, diabetes, here presenting with diarrhea and vomiting and leg injury.  Patient had about 5 episodes of diarrhea starting this morning.  Patient also had several episodes of vomiting.  Patient states that his legs gave out and he landed on the right knee and twisted the right ankle.  He complains of right lower extremity pain.  No meds prior to arrival.  Denies any sick contacts or recent travel   The history is provided by the patient.       Prior to Admission medications   Medication Sig Start Date End Date Taking? Authorizing Provider  amLODipine  (NORVASC ) 5 MG tablet Take 5 mg by mouth once. 03/06/12   Claudene Pacific, MD  carvedilol  (COREG ) 25 MG tablet Take 25 mg by mouth 2 (two) times daily with a meal.    [provider]  docusate sodium  100 MG CAPS Take 100 mg by mouth 2 (two) times daily. 03/06/12   Claudene Pacific, MD  glimepiride  (AMARYL ) 2 MG tablet Take 2 mg by mouth daily before breakfast.    [provider]  HYDROcodone -acetaminophen  (NORCO/VICODIN) 5-325 MG per tablet Take 1 tablet by mouth every 6 (six) hours as needed for pain. 08/05/12   Remonia Lacks, PA-C  methocarbamol  (ROBAXIN ) 500 MG tablet Take 1 tablet (500 mg total) by mouth 2 (two) times daily. 08/05/12   Remonia Lacks, PA-C  ondansetron  (ZOFRAN ) 4 MG tablet Take 1 tablet (4 mg total) by mouth every 8 (eight) hours as needed for nausea. 03/09/12   Devora Perkins, PA-C  oxyCODONE -acetaminophen  (PERCOCET/ROXICET) 5-325 MG per tablet Take 1 tablet by mouth every 4 (four) hours as needed for pain. 03/09/12   Devora Perkins, PA-C  promethazine  (PHENERGAN ) 25 MG tablet Take 1 tablet (25 mg total) by mouth every 6 (six) hours as needed for nausea. 08/05/12   Remonia Lacks, PA-C  ramipril (ALTACE) 10  MG capsule Take 10 mg by mouth 2 (two) times daily.    [provider]  saxagliptin HCl (ONGLYZA) 2.5 MG TABS tablet Take 2.5 mg by mouth daily.    [provider]  senna-docusate (SENOKOT-S) 8.6-50 MG per tablet Take 2 tablets by mouth daily.    [provider]  simethicone  (MYLICON) 80 MG chewable tablet Chew 80 mg by mouth every 6 (six) hours as needed. Gas pain    [provider]  simvastatin  (ZOCOR ) 40 MG tablet Take 20 mg by mouth every evening. 03/06/12   Claudene Pacific, MD  spironolactone  (ALDACTONE ) 25 MG tablet Take 25 mg by mouth 2 (two) times daily.    [provider]  Tamsulosin  HCl (FLOMAX ) 0.4 MG CAPS Take 1 capsule (0.4 mg total) by mouth daily. 03/01/12   Geiple, Joshua, PA-C  Tamsulosin  HCl (FLOMAX ) 0.4 MG CAPS Take 1 capsule (0.4 mg total) by mouth daily. 03/09/12   Devora Perkins, PA-C    Allergies: Patient has no known allergies.    Review of Systems  Gastrointestinal:  Positive for diarrhea and vomiting.  Musculoskeletal:        Right knee and ankle pain  All other systems reviewed and are negative.   Updated Vital Signs BP 119/71   Pulse 73   Temp 97.9 F (36.6 C)   Resp 16   Ht 5' 6 (  1.676 m)   Wt 81.6 kg   SpO2 100%   BMI 29.05 kg/m   Physical Exam Vitals and nursing note reviewed.  Constitutional:      Comments: Chronically ill and slightly dehydrated  HENT:     Head: Normocephalic.     Nose: Nose normal.     Mouth/Throat:     Mouth: Mucous membranes are dry.  Eyes:     Extraocular Movements: Extraocular movements intact.     Pupils: Pupils are equal, round, and reactive to light.  Cardiovascular:     Rate and Rhythm: Normal rate and regular rhythm.     Pulses: Normal pulses.     Heart sounds: Normal heart sounds.  Pulmonary:     Effort: Pulmonary effort is normal.     Breath sounds: Normal breath sounds.  Abdominal:     General: Abdomen is flat.     Palpations: Abdomen is soft.     Comments: No  abdominal tenderness  Musculoskeletal:     Cervical back: Normal range of motion and neck supple.     Comments: Mildly right knee swelling.  Diffuse right tib-fib tenderness.  Patient has some swelling of the right ankle and foot.  2+ DP pulse.  Neurological:     General: No focal deficit present.     Mental Status: He is oriented to person, place, and time.  Psychiatric:        Mood and Affect: Mood normal.        Behavior: Behavior normal.     (all labs ordered are listed, but only abnormal results are displayed) Labs Reviewed  CBC WITH DIFFERENTIAL/PLATELET  COMPREHENSIVE METABOLIC PANEL WITH GFR  LIPASE, BLOOD    EKG: None  Radiology: No results found.   Procedures   Medications Ordered in the ED  sodium chloride  0.9 % bolus 500 mL (has no administration in time range)  ondansetron  (ZOFRAN ) injection 4 mg (has no administration in time range)  fentaNYL  (SUBLIMAZE ) injection 50 mcg (has no administration in time range)                                    Medical Decision Making Kurt Moran is a 89 y.o. male here presenting with vomiting and diarrhea and fall. Will get x-rays to rule out fracture.  Will check electrolytes and hydrate patient.  8:38 PM Reviewed patient's labs and patient has a mild AKI with creatinine 2.1.  I had looked at his labs in February of this year and was 1.1.  X-rays did not show any fracture and pain is under control.  I offered him admission for acute renal failure and he states that he is not staying in the hospital.  He states that he has follow-up with his doctor and can get labs outpatient.  He request CAM Walker to help him with his ankle and foot.  Will prescribe tramadol  as needed for pain  Problems Addressed: AKI (acute kidney injury) (HCC): acute illness or injury Nausea vomiting and diarrhea: acute illness or injury  Amount and/or Complexity of Data Reviewed Labs: ordered. Decision-making details documented in ED  Course. Radiology: ordered and independent interpretation performed. Decision-making details documented in ED Course.  Risk Prescription drug management.     Final diagnoses:  None    ED Discharge Orders     None          Patt Alm Macho, MD 10/14/23  2040  

## 2023-10-14 NOTE — Discharge Instructions (Addendum)
 As we discussed, you likely have a foot contusion.  I recommend you wear the cam walker and follow-up with orthopedic doctor   Your kidney function is abnormal from vomiting diarrhea.  I recommend you take Zofran  for nausea and stay hydrated.  You need to repeat your kidney function test with your doctor this week  Please follow-up with your doctor this week  Return to ER if you have worse diarrhea or vomiting or dehydration or another fall

## 2023-10-19 DIAGNOSIS — R809 Proteinuria, unspecified: Secondary | ICD-10-CM | POA: Diagnosis not present

## 2023-10-19 DIAGNOSIS — E782 Mixed hyperlipidemia: Secondary | ICD-10-CM | POA: Diagnosis not present

## 2023-10-19 DIAGNOSIS — I10 Essential (primary) hypertension: Secondary | ICD-10-CM | POA: Diagnosis not present

## 2023-10-19 DIAGNOSIS — M1990 Unspecified osteoarthritis, unspecified site: Secondary | ICD-10-CM | POA: Diagnosis not present

## 2023-10-19 DIAGNOSIS — E1122 Type 2 diabetes mellitus with diabetic chronic kidney disease: Secondary | ICD-10-CM | POA: Diagnosis not present

## 2023-10-19 DIAGNOSIS — N1831 Chronic kidney disease, stage 3a: Secondary | ICD-10-CM | POA: Diagnosis not present

## 2023-10-23 DIAGNOSIS — R809 Proteinuria, unspecified: Secondary | ICD-10-CM | POA: Diagnosis not present

## 2023-10-23 DIAGNOSIS — N1831 Chronic kidney disease, stage 3a: Secondary | ICD-10-CM | POA: Diagnosis not present

## 2023-10-23 DIAGNOSIS — R0989 Other specified symptoms and signs involving the circulatory and respiratory systems: Secondary | ICD-10-CM | POA: Diagnosis not present

## 2023-10-23 DIAGNOSIS — M25571 Pain in right ankle and joints of right foot: Secondary | ICD-10-CM | POA: Diagnosis not present

## 2023-10-23 DIAGNOSIS — I10 Essential (primary) hypertension: Secondary | ICD-10-CM | POA: Diagnosis not present

## 2023-10-23 DIAGNOSIS — E782 Mixed hyperlipidemia: Secondary | ICD-10-CM | POA: Diagnosis not present

## 2023-10-23 DIAGNOSIS — E1122 Type 2 diabetes mellitus with diabetic chronic kidney disease: Secondary | ICD-10-CM | POA: Diagnosis not present

## 2023-10-23 DIAGNOSIS — M1990 Unspecified osteoarthritis, unspecified site: Secondary | ICD-10-CM | POA: Diagnosis not present

## 2023-10-23 DIAGNOSIS — S93401A Sprain of unspecified ligament of right ankle, initial encounter: Secondary | ICD-10-CM | POA: Diagnosis not present

## 2023-10-31 DIAGNOSIS — M25571 Pain in right ankle and joints of right foot: Secondary | ICD-10-CM | POA: Diagnosis not present

## 2024-02-12 DIAGNOSIS — E782 Mixed hyperlipidemia: Secondary | ICD-10-CM | POA: Diagnosis not present

## 2024-02-12 DIAGNOSIS — I10 Essential (primary) hypertension: Secondary | ICD-10-CM | POA: Diagnosis not present

## 2024-02-12 DIAGNOSIS — N1831 Chronic kidney disease, stage 3a: Secondary | ICD-10-CM | POA: Diagnosis not present

## 2024-02-12 DIAGNOSIS — E1122 Type 2 diabetes mellitus with diabetic chronic kidney disease: Secondary | ICD-10-CM | POA: Diagnosis not present

## 2024-02-12 DIAGNOSIS — R809 Proteinuria, unspecified: Secondary | ICD-10-CM | POA: Diagnosis not present

## 2024-02-19 DIAGNOSIS — E782 Mixed hyperlipidemia: Secondary | ICD-10-CM | POA: Diagnosis not present

## 2024-02-19 DIAGNOSIS — R0989 Other specified symptoms and signs involving the circulatory and respiratory systems: Secondary | ICD-10-CM | POA: Diagnosis not present

## 2024-02-19 DIAGNOSIS — N1831 Chronic kidney disease, stage 3a: Secondary | ICD-10-CM | POA: Diagnosis not present

## 2024-02-19 DIAGNOSIS — I10 Essential (primary) hypertension: Secondary | ICD-10-CM | POA: Diagnosis not present

## 2024-02-19 DIAGNOSIS — E1122 Type 2 diabetes mellitus with diabetic chronic kidney disease: Secondary | ICD-10-CM | POA: Diagnosis not present

## 2024-02-19 DIAGNOSIS — M1990 Unspecified osteoarthritis, unspecified site: Secondary | ICD-10-CM | POA: Diagnosis not present

## 2024-02-19 DIAGNOSIS — R809 Proteinuria, unspecified: Secondary | ICD-10-CM | POA: Diagnosis not present

## 2024-02-29 ENCOUNTER — Other Ambulatory Visit: Payer: Self-pay

## 2024-02-29 ENCOUNTER — Emergency Department (HOSPITAL_BASED_OUTPATIENT_CLINIC_OR_DEPARTMENT_OTHER): Admitting: Radiology

## 2024-02-29 ENCOUNTER — Encounter (HOSPITAL_BASED_OUTPATIENT_CLINIC_OR_DEPARTMENT_OTHER): Payer: Self-pay | Admitting: Emergency Medicine

## 2024-02-29 ENCOUNTER — Emergency Department (HOSPITAL_BASED_OUTPATIENT_CLINIC_OR_DEPARTMENT_OTHER)
Admission: EM | Admit: 2024-02-29 | Discharge: 2024-02-29 | Disposition: A | Discharge status: Home / Self Care | Source: Ambulatory Visit | Attending: Emergency Medicine | Admitting: Emergency Medicine

## 2024-02-29 DIAGNOSIS — Z79899 Other long term (current) drug therapy: Secondary | ICD-10-CM | POA: Diagnosis not present

## 2024-02-29 DIAGNOSIS — S90211A Contusion of right great toe with damage to nail, initial encounter: Secondary | ICD-10-CM | POA: Insufficient documentation

## 2024-02-29 DIAGNOSIS — M2011 Hallux valgus (acquired), right foot: Secondary | ICD-10-CM | POA: Diagnosis not present

## 2024-02-29 DIAGNOSIS — M19071 Primary osteoarthritis, right ankle and foot: Secondary | ICD-10-CM | POA: Insufficient documentation

## 2024-02-29 DIAGNOSIS — M85871 Other specified disorders of bone density and structure, right ankle and foot: Secondary | ICD-10-CM | POA: Diagnosis not present

## 2024-02-29 DIAGNOSIS — S99929A Unspecified injury of unspecified foot, initial encounter: Secondary | ICD-10-CM

## 2024-02-29 DIAGNOSIS — W228XXA Striking against or struck by other objects, initial encounter: Secondary | ICD-10-CM | POA: Diagnosis not present

## 2024-02-29 DIAGNOSIS — M79674 Pain in right toe(s): Secondary | ICD-10-CM | POA: Insufficient documentation

## 2024-02-29 DIAGNOSIS — M8580 Other specified disorders of bone density and structure, unspecified site: Secondary | ICD-10-CM | POA: Diagnosis not present

## 2024-02-29 DIAGNOSIS — S91201A Unspecified open wound of right great toe with damage to nail, initial encounter: Secondary | ICD-10-CM | POA: Insufficient documentation

## 2024-02-29 DIAGNOSIS — S99921A Unspecified injury of right foot, initial encounter: Secondary | ICD-10-CM | POA: Diagnosis present

## 2024-02-29 MED ORDER — BACITRACIN ZINC 500 UNIT/GM EX OINT
TOPICAL_OINTMENT | Freq: Two times a day (BID) | CUTANEOUS | Status: DC
  Filled 2024-02-29: qty 28.35

## 2024-02-29 MED ORDER — LIDOCAINE-PRILOCAINE 2.5-2.5 % EX CREA
TOPICAL_CREAM | Freq: Once | CUTANEOUS | Status: AC
  Administered 2024-02-29: 1 via TOPICAL
  Filled 2024-02-29: qty 5

## 2024-02-29 MED ORDER — BACITRACIN ZINC 500 UNIT/GM EX OINT: 1.0000 | TOPICAL_OINTMENT | Freq: Two times a day (BID) | CUTANEOUS | 0 refills | Status: AC

## 2024-02-29 MED ORDER — LORAZEPAM 1 MG PO TABS: 0.5000 mg | ORAL_TABLET | Freq: Once | ORAL | Status: DC

## 2024-02-29 MED ORDER — LIDOCAINE HCL (PF) 1 % IJ SOLN
30.0000 mL | Freq: Once | INTRAMUSCULAR | Status: AC
  Administered 2024-02-29: 30 mL
  Filled 2024-02-29: qty 30

## 2024-02-29 NOTE — ED Notes (Signed)
 Bacitracin and dressing applied, pt given education on wound care and dressing at home, pt and family verbalized understanding. Pt given and demonstrated proper use of post-op shoe. Pt d/c instructions, medications, and follow-up care reviewed with pt and family. Pt and family verbalized understanding and had no further questions at time of d/c. Pt CA&Ox4, ambulatory, and in NAD at time of d/c

## 2024-02-29 NOTE — Discharge Instructions (Addendum)
 You had avulsed toe which was removed in the emergency room. New nail will slowly grow and take its place.  We recommend that you keep the wound covered for the next 3 days.  Thereafter, you can remove the current dressing and apply the antibiotic ointment daily.  Make sure that you are keeping the toe covered at all times for at least 4 weeks, or until nail has started growing properly.

## 2024-02-29 NOTE — ED Triage Notes (Signed)
 Pt via pov from home with right great toe injury. The nail is not comletely off but is coming off. Pt a&o x 4; nad noted.

## 2024-02-29 NOTE — ED Provider Notes (Signed)
 [place] EMERGENCY DEPARTMENT AT [facility] Provider Note   CSN: [id number] Arrival date & time: 02/29/24  1016     Patient presents with: Toe Injury   Kurt Moran is a 89 y.o. male.   HPI     89 year old patient brought into the emergency room with chief complaint of toe injury.  Patient accompanied by daughter.  Patient slammed his great toe yesterday while he was walking onto an object.  There was immediate bleeding.  First-aid was completed yesterday.  The toe was hanging off of the cuticle.  This morning, patient continues to have pain and the nail has moved up, and he is in the emergency room hoping that the nail can be removed.  Patient unsure about his tetanus status.  He however is scared of needles and does not want tetanus.   Prior to Admission medications   Medication Sig Start Date End Date Taking? Authorizing Provider  bacitracin ointment Apply 1 Application topically 2 (two) times daily. 02/29/24  Yes Charlyn Sora, MD  amLODipine  (NORVASC ) 5 MG tablet Take 5 mg by mouth once. 03/06/12   Claudene Pacific, MD  carvedilol  (COREG ) 25 MG tablet Take 25 mg by mouth 2 (two) times daily with a meal.    [provider]  docusate sodium  100 MG CAPS Take 100 mg by mouth 2 (two) times daily. 03/06/12   Claudene Pacific, MD  glimepiride  (AMARYL ) 2 MG tablet Take 2 mg by mouth daily before breakfast.    [provider]  HYDROcodone -acetaminophen  (NORCO/VICODIN) 5-325 MG per tablet Take 1 tablet by mouth every 6 (six) hours as needed for pain. 08/05/12   Remonia Lacks, PA-C  methocarbamol  (ROBAXIN ) 500 MG tablet Take 1 tablet (500 mg total) by mouth 2 (two) times daily. 08/05/12   Remonia Lacks, PA-C  ondansetron  (ZOFRAN ) 4 MG tablet Take 1 tablet (4 mg total) by mouth every 6 (six) hours. 10/14/23   Patt Alm Macho, MD  oxyCODONE -acetaminophen  (PERCOCET/ROXICET) 5-325 MG per tablet Take 1 tablet by mouth every 4 (four) hours as needed for pain. 03/09/12    Devora Perkins, PA-C  promethazine  (PHENERGAN ) 25 MG tablet Take 1 tablet (25 mg total) by mouth every 6 (six) hours as needed for nausea. 08/05/12   Remonia Lacks, PA-C  ramipril (ALTACE) 10 MG capsule Take 10 mg by mouth 2 (two) times daily.    [provider]  saxagliptin HCl (ONGLYZA) 2.5 MG TABS tablet Take 2.5 mg by mouth daily.    [provider]  senna-docusate (SENOKOT-S) 8.6-50 MG per tablet Take 2 tablets by mouth daily.    [provider]  simethicone  (MYLICON) 80 MG chewable tablet Chew 80 mg by mouth every 6 (six) hours as needed. Gas pain    [provider]  simvastatin  (ZOCOR ) 40 MG tablet Take 20 mg by mouth every evening. 03/06/12   Claudene Pacific, MD  spironolactone  (ALDACTONE ) 25 MG tablet Take 25 mg by mouth 2 (two) times daily.    [provider]  Tamsulosin  HCl (FLOMAX ) 0.4 MG CAPS Take 1 capsule (0.4 mg total) by mouth daily. 03/01/12   Geiple, Joshua, PA-C  Tamsulosin  HCl (FLOMAX ) 0.4 MG CAPS Take 1 capsule (0.4 mg total) by mouth daily. 03/09/12   Devora Perkins, PA-C  traMADol  (ULTRAM ) 50 MG tablet Take 1 tablet (50 mg total) by mouth every 6 (six) hours as needed. 10/14/23   Patt Alm Macho, MD    Allergies: Patient has no known allergies.    Review  of Systems  All other systems reviewed and are negative.   Updated Vital Signs BP 128/73   Pulse 63   Temp 98.5 F (36.9 C)   Resp 20   Ht 5' 6 (1.676 m)   Wt 81.2 kg   SpO2 100%   BMI 28.89 kg/m   Physical Exam Vitals and nursing note reviewed.  Constitutional:      Appearance: He is well-developed.  HENT:     Head: Atraumatic.  Cardiovascular:     Rate and Rhythm: Normal rate.  Pulmonary:     Effort: Pulmonary effort is normal.  Musculoskeletal:     Cervical back: Neck supple.     Comments: Right great toe is edematous and patient's nail appears to be deroofed.  It does not appear to be loose at this time. The nail plate is still embedded in the nail matrix  and there is no evidence of trauma over the proximal nail fold.  There is some dried blood on both lateral nail folds.  The nail plate itself is raised and there appears to be subungual hematoma  Skin:    General: Skin is warm.  Neurological:     Mental Status: He is alert and oriented to person, place, and time.     (all labs ordered are listed, but only abnormal results are displayed) Labs Reviewed - No data to display  EKG: None  Radiology: DG Toe Great Right Result Date: 02/29/2024 CLINICAL DATA:  toe pain EXAM: RIGHT GREAT TOE COMPARISON:  October 14, 2023 FINDINGS: Diffuse osteopenia.No acute fracture or dislocation. Hallux valgus deformity. Severe joint space loss of the first metatarsophalangeal joint. Soft tissues are unremarkable. IMPRESSION: 1. Diffuse osteopenia.  No acute fracture or dislocation. 2. 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 Electronically Signed   By: Rogelia Myers M.D.   On: 02/29/2024 11:52     Nail Removal  Date/Time: 02/29/2024 2:22 PM  Performed by: Charlyn Sora, MD Authorized by: Charlyn Sora, MD   Consent:    Consent obtained:  Verbal   Consent given by:  Patient   Risks, benefits, and alternatives were discussed: yes     Risks discussed:  Bleeding, incomplete removal, infection, permanent nail deformity and pain   Alternatives discussed:  No treatment and alternative treatment Universal protocol:    Procedure explained and questions answered to patient or proxy's satisfaction: yes     Immediately prior to procedure, a time out was called: yes     Patient identity confirmed:  Arm band Pre-procedure details:    Skin preparation:  Alcohol   Preparation: Patient was prepped and draped in the usual sterile fashion   Procedure details:    Location:  Foot   Foot location:  R big toe Nail Removal:    Nail removed:  Partial   Nail removed location: Distal 1/3 nail in its entirety.   Nail bed repaired: no     Removed  nail replaced and anchored: no   Trephination:    Subungual hematoma drained: yes   Ingrown nail:    Wedge excision of skin: no     Nail matrix removed or ablated:  None Post-procedure details:    Dressing:  4x4 sterile gauze, antibiotic ointment and post-op shoe   Procedure completion:  Tolerated well, no immediate complications .Nerve Block  Date/Time: 02/29/2024 2:23 PM  Performed by: Charlyn Sora, MD Authorized by: Charlyn Sora, MD   Consent:    Consent obtained:  Verbal   Consent given  by:  Patient   Risks, benefits, and alternatives were discussed: yes     Risks discussed:  Allergic reaction, infection, nerve damage, bleeding and pain   Alternatives discussed:  No treatment Universal protocol:    Procedure explained and questions answered to patient or proxy's satisfaction: yes     Immediately prior to procedure, a time out was called: yes     Patient identity confirmed:  Arm band Indications:    Indications:  Pain relief and procedural anesthesia Location:    Body area:  Lower extremity   Lower extremity nerve blocked: Metatarsal/Toe.   Laterality:  Right Pre-procedure details:    Skin preparation:  Alcohol Skin anesthesia:    Skin anesthesia method:  Topical application   Topical anesthetic:  EMLA cream Procedure details:    Block needle gauge:  25 G   Anesthetic injected:  Lidocaine 1% w/o epi   Steroid injected:  None   Additive injected:  None   Injection procedure:  Anatomic landmarks identified, incremental injection, negative aspiration for blood, anatomic landmarks palpated and introduced needle   Paresthesia:  None Post-procedure details:    Dressing:  Sterile dressing   Outcome:  Anesthesia achieved   Procedure completion:  Tolerated    Medications Ordered in the ED  bacitracin ointment (has no administration in time range)  lidocaine-prilocaine (EMLA) cream (1 Application Topical Given 02/29/24 1135)  lidocaine (PF) (XYLOCAINE) 1 % injection  30 mL (30 mLs Infiltration Given 02/29/24 1135)    Clinical Course as of 02/29/24 1425  Fri Feb 29, 2024  1425 Distal two thirds of the nail was removed. Subungual hematoma noted and drained.  The remainder of the nail is appropriately intact and there is no damage to the nail matrix  Wound care precautions discussed.  Follow-up with PCP discussed. [AN]    Clinical Course User Index [AN] Charlyn Sora, MD                                 Medical Decision Making Amount and/or Complexity of Data Reviewed Radiology: ordered.  Risk OTC drugs. Prescription drug management.   89 year old male comes in with chief complaint of nail injury.  Differential diagnosis includes subungual hematoma, distal toe fracture, nailbed laceration.  At this time, the nail plate itself is elevated, likely because of subungual hematoma. Family reports that yesterday the nail was barely hanging off of the cuticle, they came into the ER wanting the nail removed.  I think that would be the best next step.  Patient is scared of needles and has declined tetanus shot but has agreed to allowing numbing of the nail followed by the procedure.  We we did discuss trephination and wait and watch approach as well.  Final diagnoses:  Injury of nail bed of toe    ED Discharge Orders          Ordered    bacitracin ointment  2 times daily        02/29/24 1415               Charlyn Sora, MD 02/29/24 1425

## 2024-03-07 DIAGNOSIS — M1990 Unspecified osteoarthritis, unspecified site: Secondary | ICD-10-CM | POA: Diagnosis not present

## 2024-03-07 DIAGNOSIS — R809 Proteinuria, unspecified: Secondary | ICD-10-CM | POA: Diagnosis not present

## 2024-03-07 DIAGNOSIS — N1831 Chronic kidney disease, stage 3a: Secondary | ICD-10-CM | POA: Diagnosis not present

## 2024-03-07 DIAGNOSIS — I10 Essential (primary) hypertension: Secondary | ICD-10-CM | POA: Diagnosis not present

## 2024-03-07 DIAGNOSIS — E782 Mixed hyperlipidemia: Secondary | ICD-10-CM | POA: Diagnosis not present

## 2024-03-07 DIAGNOSIS — E1122 Type 2 diabetes mellitus with diabetic chronic kidney disease: Secondary | ICD-10-CM | POA: Diagnosis not present
